# Patient Record
Sex: Female | Born: 1975 | Race: White | Hispanic: No | Marital: Married | Smoking: Never smoker
Health system: Southern US, Community
[De-identification: ages and names within clinical notes are randomized; demographics above are authoritative.]

## PROBLEM LIST (undated history)

## (undated) DIAGNOSIS — M2550 Pain in unspecified joint: Secondary | ICD-10-CM

## (undated) DIAGNOSIS — Z87442 Personal history of urinary calculi: Secondary | ICD-10-CM

## (undated) DIAGNOSIS — J45909 Unspecified asthma, uncomplicated: Secondary | ICD-10-CM

## (undated) HISTORY — PX: BREAST SURGERY: SHX581

---

## 2007-11-19 HISTORY — PX: BREAST EXCISIONAL BIOPSY: SUR124

## 2008-08-16 ENCOUNTER — Ambulatory Visit: Payer: Self-pay | Admitting: Family Medicine

## 2008-08-22 ENCOUNTER — Ambulatory Visit: Payer: Self-pay | Admitting: Family Medicine

## 2008-10-05 ENCOUNTER — Ambulatory Visit: Payer: Self-pay | Admitting: Surgery

## 2008-10-14 ENCOUNTER — Ambulatory Visit: Payer: Self-pay | Admitting: Surgery

## 2009-12-11 IMAGING — MG MAM DGTL DIAGNOSTIC MAMMO W/CAD
1 series · 8 of 8 positions shown · non-contrast
Comparison: none

REASON FOR EXAM: lt breast nodule outer upper quadrant
COMMENTS:

[R CC · right · 8 of 9 slices shown]
[im 1/9]
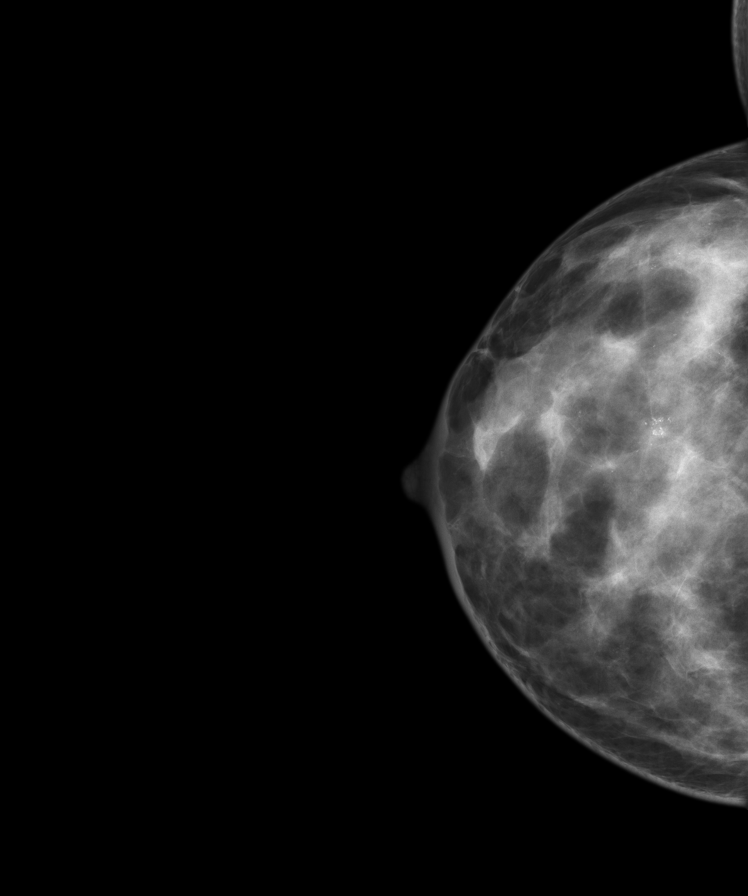
[im 2/9]
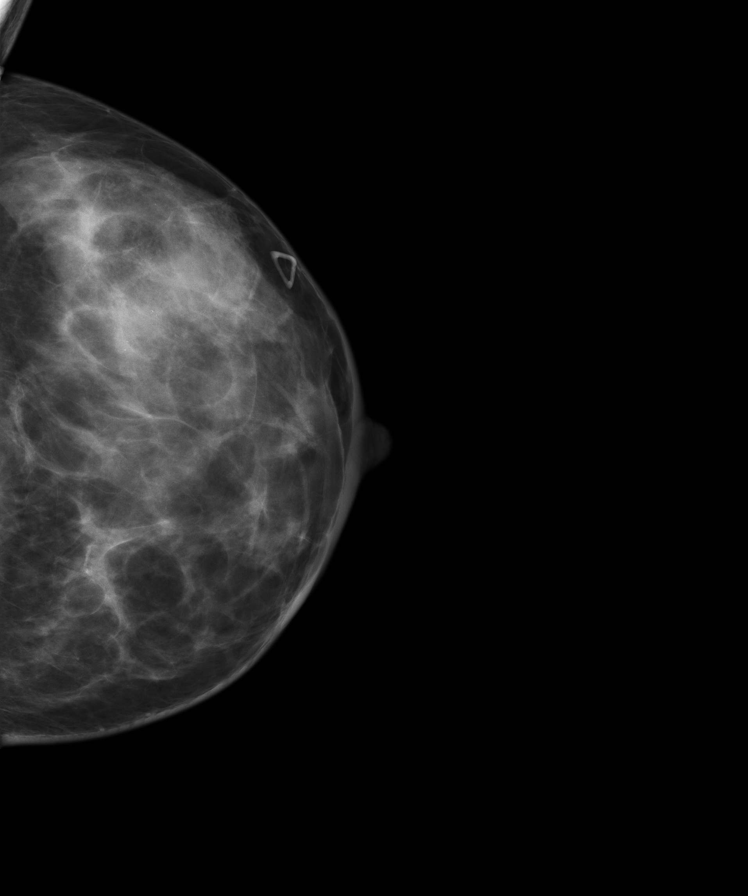
[im 3/9]
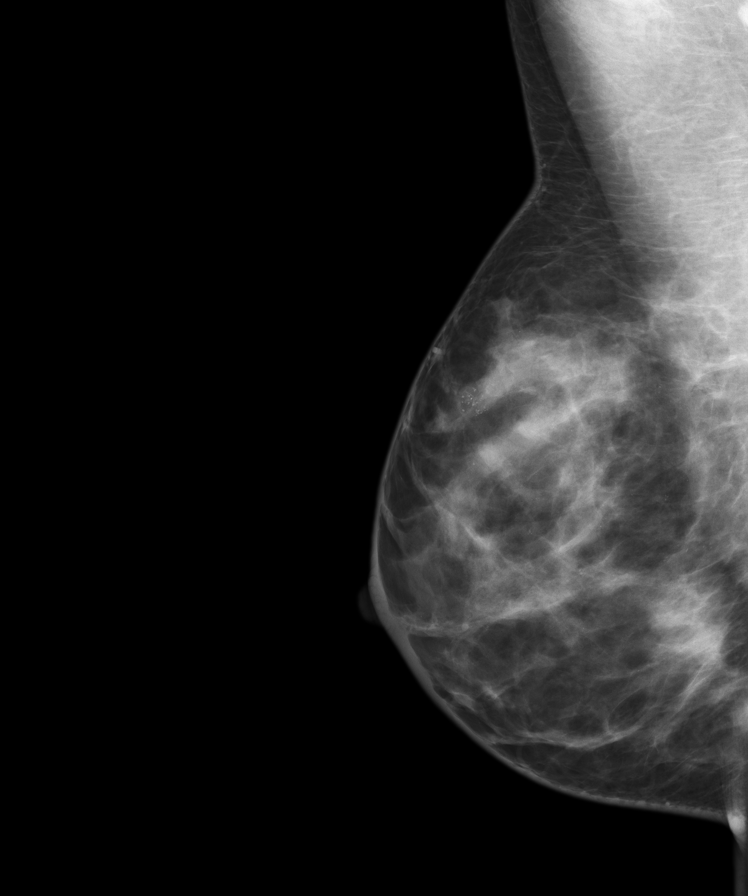
[im 4/9]
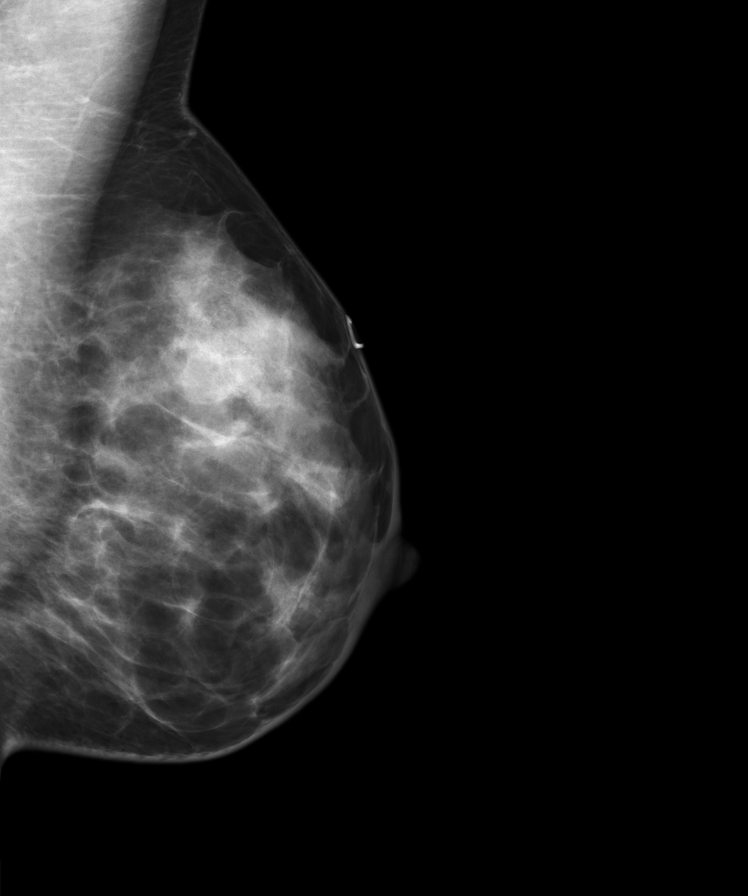
[im 5/9]
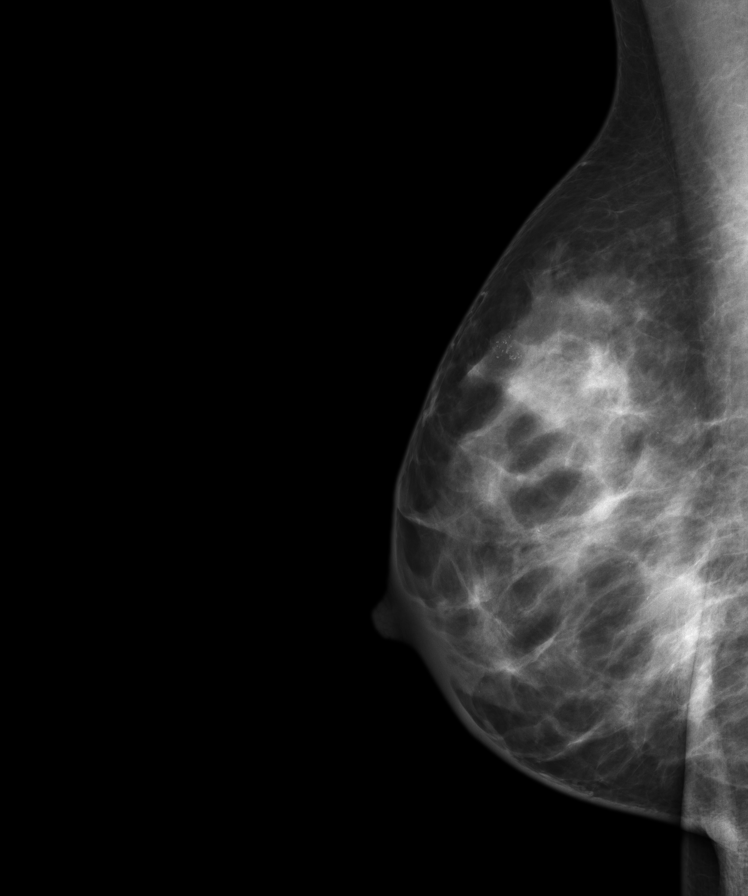
[im 6/9]
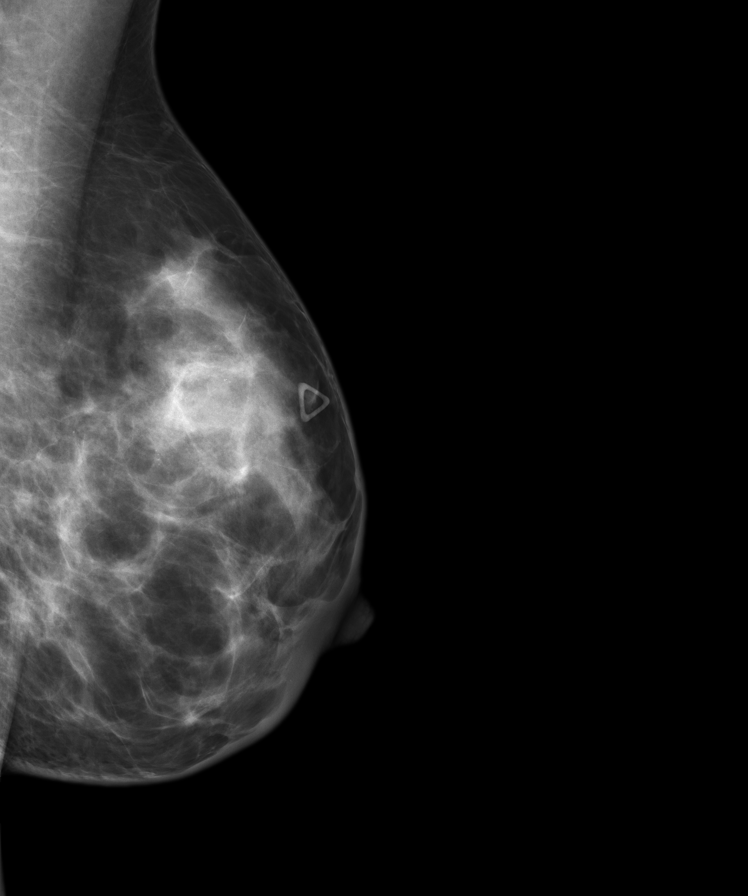
[im 7/9]
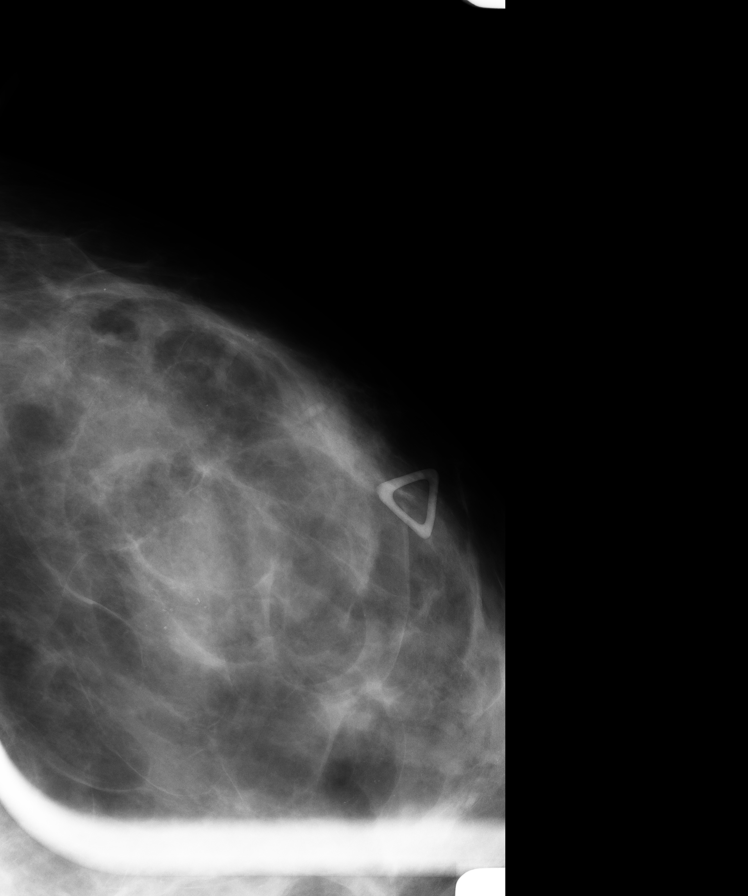
[im 9/9]
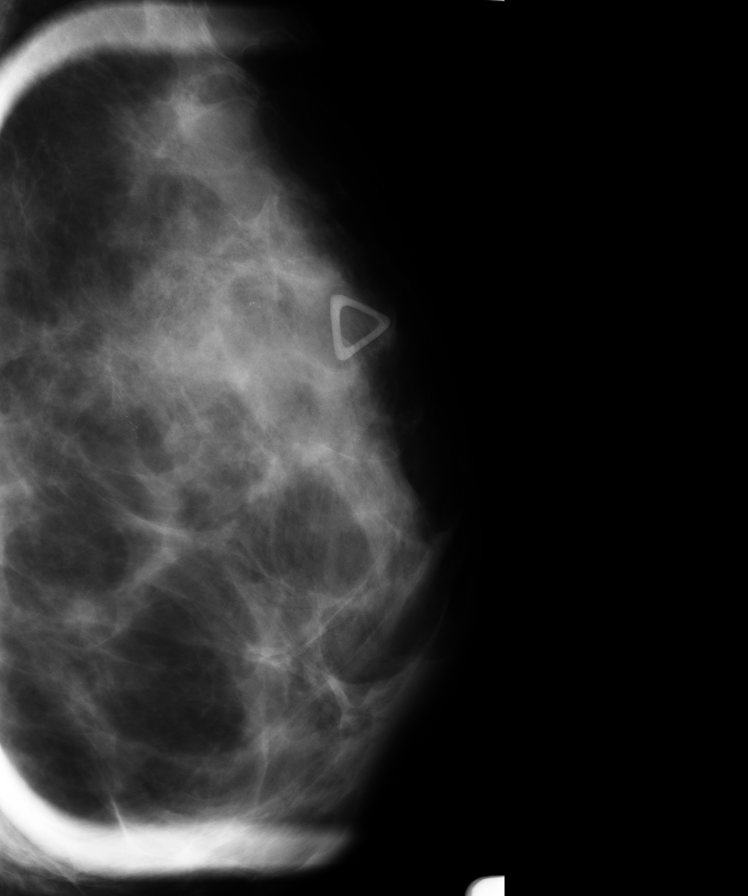

[8 of 8 positions shown; findings below may reference images not displayed]

PROCEDURE:     MAM - MAM DGTL DIAGNOSTIC MAMMO W/CAD  - August 16, 2008 [DATE]

RESULT:     The patient reports nodularity in the upper outer aspect of the
LEFT breast. The patient has an approximately one week history of a painful
nodular area here. The area was marked with a radiodense marker.

The breasts are moderately dense. Somewhat more dense parenchymal tissue is
noted on the LEFT in the upper outer quadrant as compared to the RIGHT.
However, on the RIGHT, there are multiple amorphus microcalcifications and
macrocalcifications noted slightly laterally and predominantly superiorly.
There are other scattered microcalcifications on the RIGHT as well. Spot
compression views of the upper outer aspect of the LEFT breast do not reveal
suspicious findings.  On the RIGHT, calcifications remain on a true lateral
film.
IMPRESSION: 1. On the LEFT, there is a palpable abnormality with relatively few
mammographic findings. Further evaluation with surgical consultation is
recommended. The findings could reflect an infectious process though
neoplasm is not excluded. No discrete abnormal mass is identified
mammographically or at ultrasound. Consideration for percutaneous biopsy
could be given if it is felt appropriate in the opinion of the surgeon.

2. On the RIGHT, there are indeterminate microcalcifications in the upper
and slightly outer aspect of the breast. Spot magnification views are
recommended.

BI-RADS: Category 4-Suspicious Abnormality for the LEFT breast,

BI-RADS: Category 0: Needs Additional Imaging Evaluation for the RIGHT
breast.

A NEGATIVE MAMMOGRAM REPORT DOES NOT PRECLUDE BIOPSY OR OTHER EVALUATION OF
A CLINICALLY PALPABLE OR OTHERWISE SUSPICIOUS MASS OR LESION. BREAST CANCER
MAY NOT BE DETECTED BY MAMMOGRAPHY IN UP TO 10% OF CASES.

## 2015-07-25 ENCOUNTER — Ambulatory Visit: Payer: Self-pay

## 2015-07-25 ENCOUNTER — Encounter: Payer: Self-pay | Admitting: Podiatry

## 2015-07-25 ENCOUNTER — Ambulatory Visit (INDEPENDENT_AMBULATORY_CARE_PROVIDER_SITE_OTHER): Payer: 59 | Admitting: Podiatry

## 2015-07-25 VITALS — BP 123/76 | HR 88 | Resp 16

## 2015-07-25 DIAGNOSIS — M79673 Pain in unspecified foot: Secondary | ICD-10-CM

## 2015-07-25 DIAGNOSIS — M216X9 Other acquired deformities of unspecified foot: Secondary | ICD-10-CM | POA: Diagnosis not present

## 2015-07-25 DIAGNOSIS — M2580 Other specified joint disorders, unspecified joint: Secondary | ICD-10-CM

## 2015-07-25 MED ORDER — DICLOFENAC SODIUM 75 MG PO TBEC: 75.0000 mg | DELAYED_RELEASE_TABLET | Freq: Two times a day (BID) | ORAL | Status: AC

## 2015-07-26 NOTE — Progress Notes (Signed)
Subjective:     Patient ID: Kim Curtis, female   DOB: 1976/02/01, 39 y.o.   MRN: [id number]  HPI 39 year old female presents the office today with pain to the inside portion of both of her feet most ball of the foot. She states is in ongoing for about 6 months, and has been progressive. She has pain to the inside ball of her foot after she is on her feet for quite some time (she is a Producer, television/film/video), but it is becoming more consistent. She also has pain when she wears heels or wedge shoes. She's had no previous treatment for this. She denies any history of injury or trauma. Denies these swelling or redness. No tenderness. The pain does not wake up at night. No other complaints at this time.  Review of Systems  All other systems reviewed and are negative.      Objective:   Physical Exam AAO x3, NAD DP/PT pulses palpable bilaterally, CRT less than 3 seconds Protective sensation intact with Simms Weinstein monofilament, vibratory sensation intact, Achilles tendon reflex intact There is tenderness to palpation overlying the plantar aspect of both the medial and lateral sesamoid bilaterally. There is a small amount of fluid underlying the sesamoid complex the left foot. There is no overlying erythema or increase in warmth. There is mpj range of motion. There is prominence the metatarsal heads with atrophy of the fat pad. No other areas of tenderness to bilateral lower extremities. MMT 5/5, ROM WNL.  No open lesions or pre-ulcerative lesions.  No overlying edema, erythema, increase in warmth to bilateral lower extremities.  No pain with calf compression, swelling, warmth, erythema bilaterally.      Assessment:     39 year old female with metatarsalgia, sesamoiditis    Plan:     -X-rays were obtained and reviewed with the patient.  -Treatment options discussed including all alternatives, risks, and complications -Discussed likely etiology of her symptoms.  -Metatarsal pads dispensed to help  offload the symptomatic area. -Prescribed voltaren. Discussed side effects of the medication and directed to stop if any are to occur and call the office. Hold off on mobic or other anti-inflammatories.  -Discussed that she'll likely benefit from custom orthotics. She will purchase an over-the-counter pair. -Follow-up in 4 weeks if symptoms continue or sooner if any problems arise. In the meantime, encouraged to call the office with any questions, concerns, change in symptoms.   Ovid Curd, DPM

## 2015-08-29 ENCOUNTER — Ambulatory Visit: Payer: 59 | Admitting: Podiatry

## 2015-09-05 ENCOUNTER — Encounter: Payer: Self-pay | Admitting: Podiatry

## 2015-09-05 ENCOUNTER — Ambulatory Visit (INDEPENDENT_AMBULATORY_CARE_PROVIDER_SITE_OTHER): Payer: 59 | Admitting: Podiatry

## 2015-09-05 VITALS — BP 109/81 | HR 92 | Resp 18

## 2015-09-05 DIAGNOSIS — M216X9 Other acquired deformities of unspecified foot: Secondary | ICD-10-CM

## 2015-09-05 DIAGNOSIS — M2580 Other specified joint disorders, unspecified joint: Secondary | ICD-10-CM | POA: Diagnosis not present

## 2015-09-09 NOTE — Progress Notes (Signed)
Patient ID: Earnest BaileyGinger M Tortora, female   DOB: 03/07/1976, 39 y.o.   MRN: [id number]  Subjective: Patient presents the office in follow-up evaluation of metatarsalgia and sesamoiditis. She states that the offloading pads dispensed last time helped tremendously. She gets occasional discomfort at times however she feels that she is much improved. She is inquiring about possibly purchasing the pads. She also states that she was taken for full tear in what seems to help as well. She denies any side effects the medicine. No other complaints at this time.  Objective: AAO 3, NAD DP/PT pulses 2/4, CRT less than 3 seconds Protective sensation intact with Simms wants monofilament At this time there is no specific tenderness to palpation along the metatarsal heads on the sesamoids to bilateral feet. There is no area tenderness to bilateral lower extremity. There is edema, erythema, increased warmth bilaterally. no open lesions or pre-ulcerative lesions. MMT 5/5, ROM WNL No pain with calf compression, swelling, warmth, erythema.  Assessment: 39 year old female resolving metatarsalgia and cellulitis with metatarsal offloading pads  Plan: -Treatment options discussed including all alternatives, risks, and complications -I discussed custom orthotics to help incorporate a metatarsal pad. She wishes to hold off on that. -Continue metatarsal pads. She did purchase more these today. -Continue supportive shoe gear. Hold off on high heel shoes. -Follow-up of symptoms recur or worsen. In the meantime call the office with any questions, concerns, change in symptoms.  Ovid CurdMatthew Wagoner, DPM

## 2016-05-31 ENCOUNTER — Ambulatory Visit: Payer: Self-pay | Admitting: Physician Assistant

## 2016-05-31 ENCOUNTER — Encounter: Payer: Self-pay | Admitting: Physician Assistant

## 2016-05-31 VITALS — BP 100/75 | HR 79 | Temp 98.4°F

## 2016-05-31 DIAGNOSIS — S39012A Strain of muscle, fascia and tendon of lower back, initial encounter: Secondary | ICD-10-CM

## 2016-05-31 MED ORDER — METHOCARBAMOL 750 MG PO TABS: 750.0000 mg | ORAL_TABLET | Freq: Four times a day (QID) | ORAL | Status: DC

## 2016-05-31 NOTE — Progress Notes (Signed)
   Subjective:Back spasms    Patient ID: Kim BaileyGinger M Urista, female    DOB: 09/15/1976, 40 y.o.   MRN: [id number]  HPI Patient c/o right lateral back spasms for 3 days. Denies increase activity. Denies radicular pain. No Urinary compliant. No palliative measures for compliant.    Review of Systems  Arthritis    Objective:   Physical Exam No acute distress. No spinal deformity . No spinal guarding. F/E ROM with right paraspinal muscle spasms. No CVA guarding.       Assessment & Plan:Muscle strain  Trial of Robaxin for 3-5 days. Follow 3 days if no improvement.

## 2017-02-18 ENCOUNTER — Ambulatory Visit: Payer: Self-pay | Admitting: Physician Assistant

## 2017-02-18 ENCOUNTER — Encounter: Payer: Self-pay | Admitting: Physician Assistant

## 2017-02-18 VITALS — BP 128/78 | HR 101 | Temp 98.3°F

## 2017-02-18 DIAGNOSIS — M779 Enthesopathy, unspecified: Secondary | ICD-10-CM

## 2017-02-18 NOTE — Progress Notes (Signed)
S: c/o r thumb and wrist pain, states she planted a few plants this weekend and now her thumb really hurts, also index finger keeps getting stuck, hx of arthritic type problems with no dx of RA and tendonitis  O: vitals wnl, nad, skin intact, r thumb is a little tender, full rom, no swelling or bruising noted, r index finger with full rom, n/v intact; thumb spica splint applied  A: tendonitis  P: f/u with ortho if not better in 1 week, use diclofenac

## 2017-03-21 ENCOUNTER — Ambulatory Visit: Payer: Self-pay | Admitting: Physician Assistant

## 2017-03-24 ENCOUNTER — Other Ambulatory Visit: Payer: Self-pay | Admitting: Family Medicine

## 2017-03-24 DIAGNOSIS — R9389 Abnormal findings on diagnostic imaging of other specified body structures: Secondary | ICD-10-CM

## 2017-11-04 ENCOUNTER — Encounter
Admission: RE | Admit: 2017-11-04 | Discharge: 2017-11-04 | Disposition: A | Discharge status: Home / Self Care | Payer: Managed Care, Other (non HMO) | Source: Ambulatory Visit | Attending: Obstetrics and Gynecology | Admitting: Obstetrics and Gynecology

## 2017-11-04 ENCOUNTER — Encounter: Payer: Self-pay | Admitting: *Deleted

## 2017-11-04 ENCOUNTER — Other Ambulatory Visit: Payer: Self-pay

## 2017-11-04 DIAGNOSIS — Z87442 Personal history of urinary calculi: Secondary | ICD-10-CM | POA: Insufficient documentation

## 2017-11-04 DIAGNOSIS — Z01812 Encounter for preprocedural laboratory examination: Secondary | ICD-10-CM | POA: Diagnosis not present

## 2017-11-04 DIAGNOSIS — M259 Joint disorder, unspecified: Secondary | ICD-10-CM | POA: Insufficient documentation

## 2017-11-04 DIAGNOSIS — J45909 Unspecified asthma, uncomplicated: Secondary | ICD-10-CM | POA: Diagnosis not present

## 2017-11-04 HISTORY — DX: Personal history of urinary calculi: Z87.442

## 2017-11-04 HISTORY — DX: Unspecified asthma, uncomplicated: J45.909

## 2017-11-04 HISTORY — DX: Pain in unspecified joint: M25.50

## 2017-11-04 LAB — BASIC METABOLIC PANEL
Anion gap: 8 (ref 5–15)
BUN: 13 mg/dL (ref 6–20)
CHLORIDE: 105 mmol/L (ref 101–111)
CO2: 24 mmol/L (ref 22–32)
CREATININE: 0.67 mg/dL (ref 0.44–1.00)
Calcium: 9.6 mg/dL (ref 8.9–10.3)
GFR calc Af Amer: >60 mL/min (ref >60)
GFR calc non Af Amer: >60 mL/min (ref >60)
GLUCOSE: 127 mg/dL — AB (ref 65–99)
POTASSIUM: 3.9 mmol/L (ref 3.5–5.1)
Sodium: 137 mmol/L (ref 135–145)

## 2017-11-04 LAB — CBC
HCT: 42.5 % (ref 35.0–47.0)
HEMOGLOBIN: 14.4 g/dL (ref 12.0–16.0)
MCH: 31.7 pg (ref 26.0–34.0)
MCHC: 33.9 g/dL (ref 32.0–36.0)
MCV: 93.4 fL (ref 80.0–100.0)
PLATELETS: UNDETERMINED 10*3/uL (ref 150–440)
RBC: 4.55 MIL/uL (ref 3.80–5.20)
RDW: 12.8 % (ref 11.5–14.5)
WBC: 11.9 10*3/uL — AB (ref 3.6–11.0)

## 2017-11-04 LAB — TYPE AND SCREEN
ABO/RH(D): A POS
Antibody Screen: NEGATIVE

## 2017-11-04 NOTE — Patient Instructions (Signed)
Your procedure is scheduled on: Friday 12/28 Report to Day Surgery. To find out your arrival time please call [phone] between 1PM - 3PM on Thurs. 12.27.  Remember: Instructions that are not followed completely may result in serious medical risk, up to and including death, or upon the discretion of your surgeon and anesthesiologist your surgery may need to be rescheduled.     _X__ 1. Do not eat food after midnight the night before your procedure.                 No gum chewing or hard candies. You may drink clear liquids up to 2 hours                 before you are scheduled to arrive for your surgery- DO not drink clear                 liquids within 2 hours of the start of your surgery.                 Clear Liquids include:  water, apple juice without pulp, clear carbohydrate                 drink such as Clearfast of Gartorade, Black Coffee or Tea (Do not add                 anything to coffee or tea).     _X__ 2.  No Alcohol for 24 hours before or after surgery.   __ 3.  Do Not Smoke or use e-cigarettes For 24 Hours Prior to Your Surgery.                 Do not use any chewable tobacco products for at least 6 hours prior to                 surgery.  ____  4.  Bring all medications with you on the day of surgery if instructed.   _x___  5.  Notify your doctor if there is any change in your medical condition      (cold, fever, infections).     Do not wear jewelry, make-up, hairpins, clips or nail polish. Do not wear lotions, powders, or perfumes. You may wear deodorant. Do not shave 48 hours prior to surgery. Men may shave face and neck. Do not bring valuables to the hospital.    [facility] is not responsible for any belongings or valuables.  Contacts, dentures or bridgework may not be worn into surgery. Leave your suitcase in the car. After surgery it may be brought to your room. For patients admitted to the hospital, discharge time is determined by  your treatment team.   Patients discharged the day of surgery will not be allowed to drive home.   Please read over the following fact sheets that you were given:    __x__ Take these medicines the morning of surgery with A SIP OF WATER:    1. traMADol (ULTRAM) 50 MG tablet if needed  2.   3.   4.  5.  6.  ____ Fleet Enema (as directed)   _x___ Use CHG Soap as directed  _x___ Use inhalers on the day of surgeryalbuterol (PROVENTIL HFA;VENTOLIN HFA) 108 (90 Base) MCG/ACT inhaler,budesonide-formoterol (SYMBICORT) 160-4.5 MCG/ACT inhaler  ____ Stop metformin 2 days prior to surgery    ____ Take 1/2 of usual insulin dose the night before surgery. No insulin the morning  of surgery.   ____ Stop Coumadin/Plavix/aspirin on   __x__ Stop Anti-inflammatories on diclofenac (VOLTAREN) 75 MG EC tablet 12/21 No Ibuprofen or Aleve May take tylenol   ____ Stop supplements until after surgery.    ____ Bring C-Pap to the hospital.

## 2017-11-04 NOTE — Pre-Procedure Instructions (Signed)
Received call from lab platelets clumping unable to do platelet count.  Dr Dalbert GarnetBeasley

## 2017-11-04 NOTE — H&P (Signed)
Patient ID: Kim Curtis is a 41 y.o. female presenting with Pre Op Consulting  on 11/04/2017  HPI: Chronic pelvic pain, worsening over last three months. Is now daily, significantly negatively interfering with quality of life. She is unable to function without debilitating pain, in pelvic girdle, and particularly in her back. Tramadol helps but causes itching. She declined Lupron trial. She denies bladder sx, occasionally has pain with bowel movements but not frequently.  She has been treated as though she has endometriosis, but has not had a good response to hormonal modulation recently. Her back pain is significantly bothersome.  She [facility], who recommended a [facility]  Workup:  Pap: 11/18- neg/neg EMBx: Because my suspicion for endometrial cancer is low, and because all pelvic exams are associated with significantly pelvic pain, I have deferred an EMBx on this 41yo patient without AUB  TVUS:   Retroverted ut, measuring 7x4x4cm Left ovarian cyst, complex Normal right ovary.  No prior abdominal surgeries   Past Medical History:  has a past medical history of Anxiety, unspecified, Arthritis, Asthma without status asthmaticus, unspecified, Chronic pelvic pain in female, Dysmenorrhea, Myalgia, and Sacroiliitis (CMS-HCC).  Past Surgical History:  has a past surgical history that includes Mastectomy partial / lumpectomy; WISDOM TEETH; and Breast surgery. Family History: family history includes Breast cancer in her mother; High blood pressure (Hypertension) in her father and mother; Leukemia in her father. Social History:  reports that she has never smoked. She has never used smokeless tobacco. She reports that she does not drink alcohol or use drugs. OB/GYN History:          OB History    Gravida  2   Para  2   Term  2   Preterm      AB      Living  2     SAB      TAB      Ectopic      Molar      Multiple       Live Births             Allergies: is allergic to tramadol. Medications:  Current Outpatient Medications:  .  albuterol 90 mcg/actuation inhaler, Inhale 2 inhalations into the lungs every 6 (six) hours as needed for Wheezing., Disp: 1 Inhaler, Rfl: 1 .  benztropine (COGENTIN) 0.5 MG tablet, Take 1 tablet (0.5 mg total) by mouth once daily, Disp: 30 tablet, Rfl: 1 .  budesonide-formoterol (SYMBICORT) 160-4.5 mcg/actuation inhaler, Inhale 2 inhalations into the lungs 2 (two) times daily., Disp: 1 Inhaler, Rfl: 1 .  diclofenac (VOLTAREN) 75 MG EC tablet, Take 1 tablet (75 mg total) by mouth 2 (two) times daily., Disp: 60 tablet, Rfl: 3 .  DULoxetine (CYMBALTA) 30 MG DR capsule, TAKE ONE CAPSULE BY MOUTH ONCE DAILY, Disp: 30 capsule, Rfl: 11 .  gabapentin (NEURONTIN) 300 MG capsule, Take one cap nightly, can double to two caps nightly if needed as needed, Disp: 90 capsule, Rfl: 1 .  HYDROcodone-acetaminophen (NORCO) 5-325 mg tablet, Take 1 by mouth every 6 hours as needed for pain., Disp: 20 tablet, Rfl: 0 .  methylPREDNISolone (MEDROL DOSEPACK) 4 mg tablet, Follow package directions., Disp: 21 tablet, Rfl: 0 .  norgestrel-ethinyl estradiol (OGESTREL) 0.5-50 mg-mcg tablet, Take 1 tablet by mouth once daily, Disp: 3 Package, Rfl: 4 .  traMADol (ULTRAM) 50 mg tablet, Take 1 tablet up to twice daily every 12 hours as needed,  Disp: 20 tablet, Rfl: 0   Review of Systems: No SOB, no palpitations or chest pain, no new lower extremity edema, no nausea or vomiting or bowel or bladder complaints. See HPI for gyn specific ROS.   Exam:   BP 107/81   Pulse (!) 112   Wt 71.7 kg (158 lb)   BMI 27.99 kg/m   General: Patient is well-groomed, well-nourished, appears stated age in no acute distress  HEENT: head is atraumatic and normocephalic, trachea is midline, neck is supple with no palpable nodules  CV: Regular rhythm and normal heart rate, no murmur  Pulm: Clear to  auscultation throughout lung fields with no wheezing, crackles, or rhonchi. No increased work of breathing  Abdomen: soft , no mass, non-tender, no rebound tenderness, no hepatomegaly  Pelvic: deferred today   Impression:   The encounter diagnosis was Preop examination.    Plan:   Patient returns for a preoperative discussion regarding her plans to proceed with surgical treatment of her acute on chronic pelvic pain by total laparoscopic hysterectomy with bilateral salpingectomy and possible unilateral oopherectomy procedure. We will perform a cystoscopy with hydro- distention to evaluate the urinary tract after the procedure.   The patient and I discussed the technical aspects of the procedure including the potential for risks and complications. These include but are not limited to the risk of infection requiring post-operative antibiotics or further procedures. We talked about the risk of injury to adjacent organs including bladder, bowel, ureter, blood vessels or nerves. We talked about the need to convert to an open incision. We talked about the possible need for blood transfusion. We talked aboutpostop complications such asthromboembolic or cardiopulmonary complications. All of her questions were answered.  Her preoperative exam was completed and the appropriate consents were signed. She is scheduled to undergo this procedure in the near future.  Specific Peri-operative Considerations:  - Consent: obtained today - Health Maintenance: up to date - Labs: CBC, CMP preoperatively - Studies: EKG, CXR preoperatively - Bowel Preparation: None required - Abx:  Cefoxitin 2g - VTE ppx: SCDs perioperatively - Glucose Protocol: n/a - Beta-blockade: n/a  She is aware that this surgery may not solve her pain, but that given her sx it is reasonable to pursue. I have offered her a trial of Lupron, and dx lap with bx, and she would like to proceed to definitive surgery. I have  offered a referral to GI to consider a GI etiology to her pain, and she would like to move ahead with hysterectomy. She has seen the MIGS specialist who recommended hysterectomy. She consented now for laparotomy if needed.   If it appears that endometriosis is the culprit and she does have an endometrioma, I will remove her ovary.  She is aware that we will recommend pelvic floor physical therapy after surgery to assist with pelvic floor muscular involvement.  She is aware that though two tramadol tabs daily controls her pain, after surgery if she continues to need this level to function, she will need to follow with the chronic pain clinic, and I offered her the chronic pelvic pain clinic referral again today.  My hope is that she will feel so significantly better that any residual sx will be manageable. For now, though, she is spending full days in bed due to pain.

## 2017-11-13 MED ORDER — CEFAZOLIN SODIUM-DEXTROSE 2-4 GM/100ML-% IV SOLN
2.0000 g | INTRAVENOUS | Status: AC
  Administered 2017-11-14: 2 g via INTRAVENOUS

## 2017-11-14 ENCOUNTER — Observation Stay
Admission: RE | Admit: 2017-11-14 | Discharge: 2017-11-15 | Disposition: A | Discharge status: Home / Self Care | Payer: Managed Care, Other (non HMO) | Source: Ambulatory Visit | Attending: Obstetrics and Gynecology | Admitting: Obstetrics and Gynecology

## 2017-11-14 ENCOUNTER — Other Ambulatory Visit: Payer: Self-pay

## 2017-11-14 ENCOUNTER — Ambulatory Visit: Payer: Managed Care, Other (non HMO) | Admitting: Anesthesiology

## 2017-11-14 ENCOUNTER — Encounter
Admission: RE | Disposition: A | Discharge status: Home / Self Care | Payer: Self-pay | Source: Ambulatory Visit | Attending: Obstetrics and Gynecology

## 2017-11-14 ENCOUNTER — Encounter: Payer: Self-pay | Admitting: *Deleted

## 2017-11-14 DIAGNOSIS — N8302 Follicular cyst of left ovary: Secondary | ICD-10-CM | POA: Insufficient documentation

## 2017-11-14 DIAGNOSIS — N939 Abnormal uterine and vaginal bleeding, unspecified: Secondary | ICD-10-CM | POA: Diagnosis not present

## 2017-11-14 DIAGNOSIS — F419 Anxiety disorder, unspecified: Secondary | ICD-10-CM | POA: Insufficient documentation

## 2017-11-14 DIAGNOSIS — R102 Pelvic and perineal pain unspecified side: Secondary | ICD-10-CM | POA: Diagnosis present

## 2017-11-14 DIAGNOSIS — G8929 Other chronic pain: Secondary | ICD-10-CM | POA: Diagnosis not present

## 2017-11-14 DIAGNOSIS — M1990 Unspecified osteoarthritis, unspecified site: Secondary | ICD-10-CM | POA: Diagnosis not present

## 2017-11-14 DIAGNOSIS — N946 Dysmenorrhea, unspecified: Secondary | ICD-10-CM | POA: Insufficient documentation

## 2017-11-14 DIAGNOSIS — Z79899 Other long term (current) drug therapy: Secondary | ICD-10-CM | POA: Diagnosis not present

## 2017-11-14 HISTORY — PX: LAPAROSCOPIC BILATERAL SALPINGECTOMY: SHX5889

## 2017-11-14 HISTORY — PX: LAPAROSCOPIC HYSTERECTOMY: SHX1926

## 2017-11-14 HISTORY — PX: CYSTO WITH HYDRODISTENSION: SHX5453

## 2017-11-14 LAB — POCT PREGNANCY, URINE: Preg Test, Ur: NEGATIVE

## 2017-11-14 LAB — ABO/RH: ABO/RH(D): A POS

## 2017-11-14 LAB — PLATELET COUNT: Platelets: 272 10*3/uL (ref 150–440)

## 2017-11-14 SURGERY — HYSTERECTOMY, TOTAL, LAPAROSCOPIC
Anesthesia: General

## 2017-11-14 MED ORDER — FENTANYL CITRATE (PF) 250 MCG/5ML IJ SOLN
INTRAMUSCULAR | Status: AC
  Filled 2017-11-14: qty 5

## 2017-11-14 MED ORDER — MINOCYCLINE HCL ER 90 MG PO CP24
90.0000 mg | ORAL_CAPSULE | Freq: Every evening | ORAL | Status: DC | PRN
  Filled 2017-11-14: qty 1

## 2017-11-14 MED ORDER — ONDANSETRON HCL 4 MG/2ML IJ SOLN: 4.0000 mg | Freq: Four times a day (QID) | INTRAMUSCULAR | Status: DC | PRN

## 2017-11-14 MED ORDER — HYDROMORPHONE HCL 1 MG/ML IJ SOLN
INTRAMUSCULAR | Status: AC
  Filled 2017-11-14: qty 1

## 2017-11-14 MED ORDER — LIDOCAINE HCL (CARDIAC) 20 MG/ML IV SOLN
INTRAVENOUS | Status: DC | PRN
  Administered 2017-11-14: 100 mg via INTRAVENOUS

## 2017-11-14 MED ORDER — ACETAMINOPHEN 500 MG PO TABS
1000.0000 mg | ORAL_TABLET | Freq: Four times a day (QID) | ORAL | Status: DC
  Administered 2017-11-14 – 2017-11-15 (×3): 1000 mg via ORAL
  Filled 2017-11-14 (×3): qty 2

## 2017-11-14 MED ORDER — DOCUSATE SODIUM 100 MG PO CAPS
100.0000 mg | ORAL_CAPSULE | Freq: Two times a day (BID) | ORAL | Status: DC
  Administered 2017-11-14 – 2017-11-15 (×2): 100 mg via ORAL
  Filled 2017-11-14 (×2): qty 1

## 2017-11-14 MED ORDER — FENTANYL CITRATE (PF) 100 MCG/2ML IJ SOLN
INTRAMUSCULAR | Status: AC
  Administered 2017-11-14: 25 ug via INTRAVENOUS
  Filled 2017-11-14: qty 2

## 2017-11-14 MED ORDER — KETOROLAC TROMETHAMINE 30 MG/ML IJ SOLN: 30.0000 mg | Freq: Once | INTRAMUSCULAR | Status: DC

## 2017-11-14 MED ORDER — OXYCODONE HCL 5 MG PO CAPS: 5.0000 mg | ORAL_CAPSULE | Freq: Four times a day (QID) | ORAL | 0 refills | Status: DC | PRN

## 2017-11-14 MED ORDER — TRAMADOL HCL 50 MG PO TABS
100.0000 mg | ORAL_TABLET | Freq: Four times a day (QID) | ORAL | Status: DC | PRN
  Administered 2017-11-14: 100 mg via ORAL
  Filled 2017-11-14: qty 2

## 2017-11-14 MED ORDER — OXYCODONE HCL 5 MG PO TABS
5.0000 mg | ORAL_TABLET | ORAL | Status: DC | PRN
  Administered 2017-11-14 – 2017-11-15 (×4): 5 mg via ORAL
  Filled 2017-11-14 (×6): qty 1

## 2017-11-14 MED ORDER — DULOXETINE HCL 30 MG PO CPEP
30.0000 mg | ORAL_CAPSULE | Freq: Every day | ORAL | Status: DC
  Administered 2017-11-14: 30 mg via ORAL
  Filled 2017-11-14 (×2): qty 1

## 2017-11-14 MED ORDER — ROCURONIUM BROMIDE 100 MG/10ML IV SOLN
INTRAVENOUS | Status: DC | PRN
  Administered 2017-11-14: 20 mg via INTRAVENOUS
  Administered 2017-11-14: 30 mg via INTRAVENOUS

## 2017-11-14 MED ORDER — LACTATED RINGERS IV SOLN
INTRAVENOUS | Status: DC
  Administered 2017-11-14: via INTRAVENOUS

## 2017-11-14 MED ORDER — HYDROMORPHONE HCL 1 MG/ML IJ SOLN
0.5000 mg | INTRAMUSCULAR | Status: AC | PRN
  Administered 2017-11-14 (×4): 0.5 mg via INTRAVENOUS

## 2017-11-14 MED ORDER — PROPOFOL 10 MG/ML IV BOLUS
INTRAVENOUS | Status: DC | PRN
  Administered 2017-11-14: 150 mg via INTRAVENOUS

## 2017-11-14 MED ORDER — KETOROLAC TROMETHAMINE 30 MG/ML IJ SOLN
INTRAMUSCULAR | Status: DC | PRN
  Administered 2017-11-14: 30 mg via INTRAVENOUS

## 2017-11-14 MED ORDER — LACTATED RINGERS IV SOLN
INTRAVENOUS | Status: DC | PRN
  Administered 2017-11-14: 07:00:00 via INTRAVENOUS

## 2017-11-14 MED ORDER — DICLOFENAC SODIUM 75 MG PO TBEC
75.0000 mg | DELAYED_RELEASE_TABLET | Freq: Two times a day (BID) | ORAL | Status: DC
  Administered 2017-11-15: 75 mg via ORAL
  Filled 2017-11-14 (×3): qty 1

## 2017-11-14 MED ORDER — ACETAMINOPHEN NICU IV SYRINGE 10 MG/ML
INTRAVENOUS | Status: AC
  Filled 2017-11-14: qty 1

## 2017-11-14 MED ORDER — BUPIVACAINE HCL (PF) 0.5 % IJ SOLN
INTRAMUSCULAR | Status: AC
  Filled 2017-11-14: qty 30

## 2017-11-14 MED ORDER — MIDAZOLAM HCL 2 MG/2ML IJ SOLN
1.0000 mg | Freq: Once | INTRAMUSCULAR | Status: AC
  Administered 2017-11-14: 1 mg via INTRAVENOUS

## 2017-11-14 MED ORDER — GABAPENTIN 800 MG PO TABS: 800.0000 mg | ORAL_TABLET | Freq: Every day | ORAL | 0 refills | Status: DC

## 2017-11-14 MED ORDER — BACITRACIN-NEOMYCIN-POLYMYXIN 400-5-5000 EX OINT
TOPICAL_OINTMENT | CUTANEOUS | Status: DC | PRN
  Administered 2017-11-14: 1 via TOPICAL
  Filled 2017-11-14 (×2): qty 1

## 2017-11-14 MED ORDER — LACTATED RINGERS IV SOLN
INTRAVENOUS | Status: DC
  Administered 2017-11-14: 07:00:00 via INTRAVENOUS

## 2017-11-14 MED ORDER — BENZTROPINE MESYLATE 0.5 MG PO TABS
0.5000 mg | ORAL_TABLET | Freq: Every day | ORAL | Status: DC
  Filled 2017-11-14 (×2): qty 1

## 2017-11-14 MED ORDER — FAMOTIDINE 20 MG PO TABS
ORAL_TABLET | ORAL | Status: AC
  Filled 2017-11-14: qty 1

## 2017-11-14 MED ORDER — HYDROMORPHONE HCL 1 MG/ML IJ SOLN
INTRAMUSCULAR | Status: DC | PRN
  Administered 2017-11-14: .8 mg via INTRAVENOUS

## 2017-11-14 MED ORDER — FENTANYL CITRATE (PF) 100 MCG/2ML IJ SOLN
25.0000 ug | INTRAMUSCULAR | Status: AC | PRN
  Administered 2017-11-14 (×6): 25 ug via INTRAVENOUS

## 2017-11-14 MED ORDER — HYDROMORPHONE HCL 1 MG/ML IJ SOLN: INTRAMUSCULAR | Status: DC | PRN

## 2017-11-14 MED ORDER — HYDROMORPHONE HCL 1 MG/ML IJ SOLN
INTRAMUSCULAR | Status: AC
  Administered 2017-11-14: 0.5 mg via INTRAVENOUS
  Filled 2017-11-14: qty 1

## 2017-11-14 MED ORDER — IBUPROFEN 800 MG PO TABS: 800.0000 mg | ORAL_TABLET | Freq: Three times a day (TID) | ORAL | 1 refills | Status: DC | PRN

## 2017-11-14 MED ORDER — PROPOFOL 10 MG/ML IV BOLUS
INTRAVENOUS | Status: AC
  Filled 2017-11-14: qty 20

## 2017-11-14 MED ORDER — FENTANYL CITRATE (PF) 100 MCG/2ML IJ SOLN: INTRAMUSCULAR | Status: DC | PRN

## 2017-11-14 MED ORDER — ALBUTEROL SULFATE (2.5 MG/3ML) 0.083% IN NEBU: 3.0000 mL | INHALATION_SOLUTION | Freq: Four times a day (QID) | RESPIRATORY_TRACT | Status: DC | PRN

## 2017-11-14 MED ORDER — HYDROMORPHONE HCL 1 MG/ML IJ SOLN
0.2000 mg | INTRAMUSCULAR | Status: DC | PRN
  Administered 2017-11-14 (×2): 0.4 mg via INTRAVENOUS
  Administered 2017-11-14 (×2): 0.6 mg via INTRAVENOUS
  Filled 2017-11-14 (×4): qty 1

## 2017-11-14 MED ORDER — MIDAZOLAM HCL 2 MG/2ML IJ SOLN
INTRAMUSCULAR | Status: AC
  Filled 2017-11-14: qty 2

## 2017-11-14 MED ORDER — MIDAZOLAM HCL 2 MG/2ML IJ SOLN
INTRAMUSCULAR | Status: DC | PRN
  Administered 2017-11-14: 2 mg via INTRAVENOUS

## 2017-11-14 MED ORDER — ONDANSETRON HCL 4 MG/2ML IJ SOLN
INTRAMUSCULAR | Status: AC
  Filled 2017-11-14: qty 2

## 2017-11-14 MED ORDER — CEFAZOLIN SODIUM-DEXTROSE 2-4 GM/100ML-% IV SOLN
INTRAVENOUS | Status: AC
  Filled 2017-11-14: qty 100

## 2017-11-14 MED ORDER — DEXAMETHASONE SODIUM PHOSPHATE 10 MG/ML IJ SOLN
INTRAMUSCULAR | Status: DC | PRN
  Administered 2017-11-14: 5 mg via INTRAVENOUS

## 2017-11-14 MED ORDER — MOMETASONE FURO-FORMOTEROL FUM 200-5 MCG/ACT IN AERO
2.0000 | INHALATION_SPRAY | Freq: Two times a day (BID) | RESPIRATORY_TRACT | Status: DC
  Administered 2017-11-14 – 2017-11-15 (×2): 2 via RESPIRATORY_TRACT
  Filled 2017-11-14: qty 8.8

## 2017-11-14 MED ORDER — DOCUSATE SODIUM 100 MG PO CAPS: 100.0000 mg | ORAL_CAPSULE | Freq: Two times a day (BID) | ORAL | 0 refills | Status: DC

## 2017-11-14 MED ORDER — FAMOTIDINE 20 MG PO TABS
20.0000 mg | ORAL_TABLET | Freq: Once | ORAL | Status: AC
  Administered 2017-11-14: 20 mg via ORAL

## 2017-11-14 MED ORDER — ONDANSETRON HCL 4 MG/2ML IJ SOLN: 4.0000 mg | Freq: Once | INTRAMUSCULAR | Status: DC | PRN

## 2017-11-14 MED ORDER — ACETAMINOPHEN 500 MG PO TABS: 1000.0000 mg | ORAL_TABLET | Freq: Four times a day (QID) | ORAL | 0 refills | Status: AC

## 2017-11-14 MED ORDER — FENTANYL CITRATE (PF) 100 MCG/2ML IJ SOLN
INTRAMUSCULAR | Status: DC | PRN
  Administered 2017-11-14: 100 ug via INTRAVENOUS

## 2017-11-14 MED ORDER — SIMETHICONE 80 MG PO CHEW
80.0000 mg | CHEWABLE_TABLET | Freq: Four times a day (QID) | ORAL | Status: DC | PRN
  Administered 2017-11-14 – 2017-11-15 (×2): 80 mg via ORAL
  Filled 2017-11-14 (×2): qty 1

## 2017-11-14 MED ORDER — CELECOXIB 200 MG PO CAPS
200.0000 mg | ORAL_CAPSULE | Freq: Two times a day (BID) | ORAL | Status: DC
Start: 2017-11-14 — End: 2017-11-15
  Administered 2017-11-14 – 2017-11-15 (×2): 200 mg via ORAL
  Filled 2017-11-14 (×3): qty 1

## 2017-11-14 MED ORDER — ONDANSETRON HCL 4 MG PO TABS: 4.0000 mg | ORAL_TABLET | Freq: Four times a day (QID) | ORAL | Status: DC | PRN

## 2017-11-14 MED ORDER — GABAPENTIN 300 MG PO CAPS
900.0000 mg | ORAL_CAPSULE | Freq: Every day | ORAL | Status: DC
  Administered 2017-11-14: 900 mg via ORAL
  Filled 2017-11-14: qty 3

## 2017-11-14 MED ORDER — ONDANSETRON HCL 4 MG/2ML IJ SOLN
INTRAMUSCULAR | Status: DC | PRN
  Administered 2017-11-14: 4 mg via INTRAVENOUS

## 2017-11-14 MED ORDER — SUGAMMADEX SODIUM 200 MG/2ML IV SOLN
INTRAVENOUS | Status: DC | PRN
  Administered 2017-11-14: 200 mg via INTRAVENOUS

## 2017-11-14 MED ORDER — MENTHOL 3 MG MT LOZG
1.0000 | LOZENGE | OROMUCOSAL | Status: DC | PRN
  Filled 2017-11-14: qty 9

## 2017-11-14 MED ORDER — ACETAMINOPHEN 10 MG/ML IV SOLN
INTRAVENOUS | Status: DC | PRN
  Administered 2017-11-14: 1000 mg via INTRAVENOUS

## 2017-11-14 MED ORDER — METHYLENE BLUE 0.5 % INJ SOLN
INTRAVENOUS | Status: AC
  Filled 2017-11-14: qty 10

## 2017-11-14 SURGICAL SUPPLY — 86 items
BAG URINE DRAINAGE (UROLOGICAL SUPPLIES) ×12 IMPLANT
BLADE SURG SZ11 CARB STEEL (BLADE) ×6 IMPLANT
CANISTER SUCT 1200ML W/VALVE (MISCELLANEOUS) IMPLANT
CATH FOLEY 2WAY  5CC 16FR (CATHETERS) ×2
CATH URTH 16FR FL 2W BLN LF (CATHETERS) ×4 IMPLANT
CHLORAPREP W/TINT 26ML (MISCELLANEOUS) ×6 IMPLANT
CLOSURE WOUND 1/4X4 (GAUZE/BANDAGES/DRESSINGS)
CORD MONOPOLAR M/FML 12FT (MISCELLANEOUS) ×6 IMPLANT
COUNTER NEEDLE 20/40 LG (NEEDLE) ×6 IMPLANT
COVER LIGHT HANDLE STERIS (MISCELLANEOUS) ×12 IMPLANT
DEFOGGER SCOPE WARMER CLEARIFY (MISCELLANEOUS) ×6 IMPLANT
DERMABOND ADVANCED (GAUZE/BANDAGES/DRESSINGS) ×2
DERMABOND ADVANCED .7 DNX12 (GAUZE/BANDAGES/DRESSINGS) ×4 IMPLANT
DEVICE SUTURE ENDOST 10MM (ENDOMECHANICALS) ×6 IMPLANT
DRAPE LAP W/FLUID (DRAPES) IMPLANT
DRAPE STERI POUCH LG 24X46 STR (DRAPES) ×6 IMPLANT
DRAPE UNDER BUTTOCK W/FLU (DRAPES) ×6 IMPLANT
DRSG TEGADERM 2-3/8X2-3/4 SM (GAUZE/BANDAGES/DRESSINGS) IMPLANT
DRSG TELFA 3X8 NADH (GAUZE/BANDAGES/DRESSINGS) IMPLANT
ELECT CAUTERY BLADE 6.4 (BLADE) IMPLANT
ELECT REM PT RETURN 9FT ADLT (ELECTROSURGICAL)
ELECTRODE REM PT RTRN 9FT ADLT (ELECTROSURGICAL) IMPLANT
GAUZE SPONGE 4X4 12PLY STRL (GAUZE/BANDAGES/DRESSINGS) IMPLANT
GAUZE SPONGE NON-WVN 2X2 STRL (MISCELLANEOUS) IMPLANT
GLOVE BIO SURGEON STRL SZ7 (GLOVE) ×24 IMPLANT
GLOVE INDICATOR 7.5 STRL GRN (GLOVE) ×24 IMPLANT
GOWN STRL REUS W/ TWL LRG LVL3 (GOWN DISPOSABLE) ×8 IMPLANT
GOWN STRL REUS W/ TWL XL LVL3 (GOWN DISPOSABLE) ×4 IMPLANT
GOWN STRL REUS W/TWL LRG LVL3 (GOWN DISPOSABLE) ×4
GOWN STRL REUS W/TWL XL LVL3 (GOWN DISPOSABLE) ×2
GRASPER SUT TROCAR 14GX15 (MISCELLANEOUS) ×6 IMPLANT
IRRIGATION STRYKERFLOW (MISCELLANEOUS) ×4 IMPLANT
IRRIGATOR STRYKERFLOW (MISCELLANEOUS) ×6
IV NS 1000ML (IV SOLUTION) ×2
IV NS 1000ML BAXH (IV SOLUTION) ×4 IMPLANT
KIT PINK PAD W/HEAD ARE REST (MISCELLANEOUS) ×6
KIT PINK PAD W/HEAD ARM REST (MISCELLANEOUS) ×4 IMPLANT
KIT RM TURNOVER CYSTO AR (KITS) ×6 IMPLANT
LABEL OR SOLS (LABEL) ×6 IMPLANT
LIGASURE VESSEL 5MM BLUNT TIP (ELECTROSURGICAL) ×6 IMPLANT
MANIPULATOR VCARE LG CRV RETR (MISCELLANEOUS) ×6 IMPLANT
MANIPULATOR VCARE SML CRV RETR (MISCELLANEOUS) IMPLANT
MANIPULATOR VCARE STD CRV RETR (MISCELLANEOUS) IMPLANT
NS IRRIG 500ML POUR BTL (IV SOLUTION) ×6 IMPLANT
OCCLUDER COLPOPNEUMO (BALLOONS) ×6 IMPLANT
PACK BASIN MAJOR ARMC (MISCELLANEOUS) IMPLANT
PACK GYN LAPAROSCOPIC (MISCELLANEOUS) ×6 IMPLANT
PAD OB MATERNITY 4.3X12.25 (PERSONAL CARE ITEMS) ×6 IMPLANT
PAD PREP 24X41 OB/GYN DISP (PERSONAL CARE ITEMS) ×6 IMPLANT
POUCH SPECIMEN RETRIEVAL 10MM (ENDOMECHANICALS) IMPLANT
SCISSORS METZENBAUM CVD 33 (INSTRUMENTS) ×6 IMPLANT
SET CYSTO W/LG BORE CLAMP LF (SET/KITS/TRAYS/PACK) IMPLANT
SLEEVE ENDOPATH XCEL 5M (ENDOMECHANICALS) ×6 IMPLANT
SOL PREP PVP 2OZ (MISCELLANEOUS) ×6
SOLUTION PREP PVP 2OZ (MISCELLANEOUS) ×4 IMPLANT
SPONGE VERSALON 2X2 STRL (MISCELLANEOUS)
SPONGE XRAY 4X4 16PLY STRL (MISCELLANEOUS) IMPLANT
STAPLER SKIN PROX 35W (STAPLE) IMPLANT
STRIP CLOSURE SKIN 1/4X4 (GAUZE/BANDAGES/DRESSINGS) IMPLANT
SURGILUBE 2OZ TUBE FLIPTOP (MISCELLANEOUS) IMPLANT
SUT ENDO VLOC 180-0-8IN (SUTURE) ×6 IMPLANT
SUT MNCRL 4-0 (SUTURE) ×2
SUT MNCRL 4-0 27XMFL (SUTURE) ×4
SUT MNCRL AB 4-0 PS2 18 (SUTURE) IMPLANT
SUT VIC AB 0 CT1 27 (SUTURE)
SUT VIC AB 0 CT1 27XCR 8 STRN (SUTURE) IMPLANT
SUT VIC AB 0 CT1 36 (SUTURE) ×12 IMPLANT
SUT VIC AB 0 UR5 27 (SUTURE) ×6 IMPLANT
SUT VIC AB 2-0 SH 27 (SUTURE) ×8
SUT VIC AB 2-0 SH 27XBRD (SUTURE) ×16 IMPLANT
SUT VIC AB 2-0 UR6 27 (SUTURE) IMPLANT
SUT VIC AB 4-0 SH 27 (SUTURE)
SUT VIC AB 4-0 SH 27XANBCTRL (SUTURE) IMPLANT
SUT VICRYL PLUS ABS 0 54 (SUTURE) IMPLANT
SUTURE MNCRL 4-0 27XMF (SUTURE) ×4 IMPLANT
SWABSTK COMLB BENZOIN TINCTURE (MISCELLANEOUS) IMPLANT
SYR 10ML LL (SYRINGE) ×6 IMPLANT
SYR 50ML LL SCALE MARK (SYRINGE) ×6 IMPLANT
SYR BULB IRRIG 60ML STRL (SYRINGE) IMPLANT
TRAY FOLEY W/METER SILVER 16FR (SET/KITS/TRAYS/PACK) IMPLANT
TRAY PREP VAG/GEN (MISCELLANEOUS) IMPLANT
TROCAR ENDO BLADELESS 11MM (ENDOMECHANICALS) ×6 IMPLANT
TROCAR XCEL NON-BLD 5MMX100MML (ENDOMECHANICALS) ×12 IMPLANT
TUBING INSUF HEATED (TUBING) ×6 IMPLANT
TUBING INSUFFLATION (TUBING) ×6 IMPLANT
WATER STERILE IRR 1000ML POUR (IV SOLUTION) IMPLANT

## 2017-11-14 NOTE — Anesthesia Preprocedure Evaluation (Signed)
Anesthesia Evaluation  Patient identified by MRN, date of birth, ID band Patient awake    Reviewed: Allergy & Precautions, NPO status , Patient's Chart, lab work & pertinent test results, reviewed documented beta blocker date and time   Airway Mallampati: III  TM Distance: >3 FB     Dental  (+) Chipped   Pulmonary asthma ,           Cardiovascular      Neuro/Psych    GI/Hepatic   Endo/Other    Renal/GU      Musculoskeletal   Abdominal   Peds  Hematology   Anesthesia Other Findings   Reproductive/Obstetrics                             Anesthesia Physical Anesthesia Plan  ASA: II  Anesthesia Plan: General   Post-op Pain Management:    Induction: Intravenous  PONV Risk Score and Plan:   Airway Management Planned: Oral ETT  Additional Equipment:   Intra-op Plan:   Post-operative Plan:   Informed Consent: I have reviewed the patients History and Physical, chart, labs and discussed the procedure including the risks, benefits and alternatives for the proposed anesthesia with the patient or authorized representative who has indicated his/her understanding and acceptance.     Plan Discussed with: CRNA  Anesthesia Plan Comments:         Anesthesia Quick Evaluation

## 2017-11-14 NOTE — Discharge Instructions (Signed)
Discharge instructions after   total laparoscopic hysterectomy   For the next three days, take ibuprofen and acetaminophen on a schedule, every 8 hours. You can take them together or you can intersperse them, and take one every four hours. I also gave you gabapentin for nighttime, to help you sleep and also to control pain. Take gabapentin medicines at night for at least the next 3 nights. You also have a narcotic, oxycodone, to take as needed if the above medicines don't help.  Postop constipation is a major cause of pain. Stay well hydrated, walk as you tolerate, and take over the counter senna as well as stool softeners if you need them.    Signs and Symptoms to Report Call our office at [phone] if you have any of the following.  . Fever over 100.4 degrees or higher . Severe stomach pain not relieved with pain medications . Bright red bleeding that's heavier than a period that does not slow with rest . To go the bathroom a lot (frequency), you can't hold your urine (urgency), or it hurts when you empty your bladder (urinate) . Chest pain . Shortness of breath . Pain in the calves of your legs . Severe nausea and vomiting not relieved with anti-nausea medications . Signs of infection around your wounds, such as redness, hot to touch, swelling, green/yellow drainage (like pus), bad smelling discharge . Any concerns  What You Can Expect after Surgery . You may see some pink tinged, bloody fluid and bruising around the wound. This is normal. . You may notice shoulder and neck pain. This is caused by the gas used during surgery to expand your abdomen so your surgeon could get to the uterus easier. . You may have a sore throat because of the tube in your mouth during general anesthesia. This will go away in 2 to 3 days. . You may have some stomach cramps. . You may notice spotting on your panties. . You may have pain around the incision sites.   Activities after Your  Discharge Follow these guidelines to help speed your recovery at home: . Do the coughing and deep breathing as you did in the hospital for 2 weeks. Use the small blue breathing device, called the incentive spirometer for 2 weeks. . Don't drive if you are in pain or taking narcotic pain medicine. You may drive when you can safely slam on the brakes, turn the wheel forcefully, and rotate your torso comfortably. This is typically 1-2 weeks. Practice in a parking lot or side street prior to attempting to drive regularly.  . Ask others to help with household chores for 4 weeks. . Do not lift anything heavier that 10 pounds for 4-6 weeks. This includes pets, children, and groceries. . Don't do strenuous activities, exercises, or sports like vacuuming, tennis, squash, etc. until your doctor says it is safe to do so. ---Maintain pelvic rest for 8 weeks. This means nothing in the vagina or rectum at all (no douching, tampons, intercourse) for 8 weeks.  . Walk as you feel able. Rest often since it may take two or three weeks for your energy level to return to normal.  . You may climb stairs . Avoid constipation:   -Eat fruits, vegetables, and whole grains. Eat small meals as your appetite will take time to return to normal.   -Drink 6 to 8 glasses of water each day unless your doctor has told you to limit your fluids.   -Use a laxative   or stool softener as needed if constipation becomes a problem. You may take Miralax, metamucil, Citrucil, Colace, Senekot, FiberCon, etc. If this does not relieve the constipation, try two tablespoons of Milk Of Magnesia every 8 hours until your bowels move.  . You may shower. Gently wash the wounds with a mild soap and water. Pat dry. . Do not get in a hot tub, swimming pool, etc. for 6 weeks. . Do not use lotions, oils, powders on the wounds. . Do not douche, use tampons, or have sex until your doctor says it is okay. . Take your pain medicine when you need it. The medicine  may not work as well if the pain is bad.  Take the medicines you were taking before surgery. Other medications you will need are pain medications and possibly constipation and nausea medications (Zofran).    Here is a helpful article from the website [employer], regarding constipation  Here are reasons why constipation occurs after surgery: 1) During the operation and in the recovery room, most people are given opioid pain medication, primarily through an IV, to treat moderate or severe pain. Intravenous opioids include morphine, Dilaudid and fentanyl. After surgery, patients are often prescribed opioid pain medication to take by mouth at home, including codeine, Vicodin, Norco, and Percocet. All of these medications cause constipation by slowing down the movement of your intestine. 2) Changes in your diet before surgery can be another culprit. It is common to get specific instructions to change how you normally eat or drink before your surgery, like only having liquids the day before or not having anything to eat or drink after midnight the night before surgery. For this reason, temporary dehydration may occur. This, along with not eating or only having liquids, means that you are getting less fiber than usual. Both these factors contribute to constipation. 3) Changes in your diet after surgery can also contribute to the problem. Although many people don't have dietary restrictions after operations, being under anesthesia can make you lose your appetite for several hours and maybe even days. Some people can even have nausea or vomiting. Not eating or drinking normally means that you are not getting enough fiber and you can get dehydrated, both leading to constipation. 4) Lying in a bed more than usual--which happens before, during and after surgery--combined with the medications and diet changes, all work together to slow down your colon and make your poop turn to rock.  No one likes to be  constipated.  Let's face it, it's not a pleasant feeling when you don't poop for days, then strain on the toilet to finally pass something large enough to cause damage. An ounce of prevention is worth a pound of cure, so: 1. Assume you will be constipated. 2. Plan and prepare accordingly. Post-surgery is one of those unique situations where the temporary use of laxatives can make a world of difference. Always consult with your doctor, and recognize that if you wait several days after surgery to take a laxative, the constipation might be too severe for these over-the-counter options. It is always important to discuss all medications you plan on taking with your doctor. Ask your doctor if you can start the laxative immediately after surgery. *  Here are go-to post-surgery laxatives: Senna: Senna is an herb that acts as a "stimulant laxative," meaning it increases the activity of the intestine to cause you to have a bowel movement. It comes in many forms, but senna pills are easy to take and   are sold over the counter at almost all pharmacies. Since opioid pain medications slow down the activity of the intestine, it makes sense to take a medication to help reverse that side effect. Long-term use of a stimulant laxative is not a good idea since it can make your colon "lazy" and not function properly; however, temporary use immediately after surgery is acceptable. In general, if you are able to eat a normal diet, taking senna soon after surgery works the best. Senna usually works within hours to produce a bowel movement, but this is less predictable when you are taking different medications after surgery. Try not to wait several days to start taking senna, as often it is too late by then. Just like with all medications or supplements, check with your doctor before starting new treatment.   Magnesium: Magnesium is an important mineral that our body needs. We get magnesium from some foods that we eat, especially  foods that are high in fiber such as broccoli, almonds and whole grains. There are also magnesium-based medications used to treat constipation including milk of magnesia (magnesium hydroxide), magnesium citrate and magnesium oxide. They work by drawing water into the intestine, putting it into the class of "osmotic" laxatives. Magnesium products in low doses appear to be safe, but if taken in very large doses, can lead to problems such as irregular heartbeat, low blood pressure and even death. It can also affect other medications you might be taking, therefore it is important to discuss using magnesium with your physician and pharmacist before initiating therapy. Most over-the-counter magnesium laxatives work very well to help with the constipation related to surgery, but sometimes they work too well and lead to diarrhea. Make sure you are somewhere with easy access to a bathroom, just in case.   Bisacodyl: Bisacodyl (generic name) is sold under brand names such as Dulcolax. Much like senna, it is a "stimulant laxative," meaning it makes your intestines move more quickly to push out the stool. This is another good choice to start taking as soon as your doctor says you can take a laxative after surgery. It comes in pill form and as a suppository, which is a good choice for people who cannot or are not allowed to swallow pills. Studies have shown that it works as a laxative, but like most of these medications, you should use this on a short-term basis only.   Enema: Enemas strike fear in many people, but FEAR NOT! It's nowhere near as big a deal as you may think. An enema is just a way to get some liquid into your rectum by placing a specially designed device through your anus. If you have never done one, it might seem like a painful, unpleasant, uncomfortable, complicated and lengthy procedure. But in reality, it's simple, takes just a few seconds and is highly effective. The small ready-made bottles you buy at  the pharmacy are much easier than the hose/large rubber container type. Those recommended positions illustrated in some instructions are generally not necessary to place the enema. It's very similar to the insertion of a tampon, requiring a slight squat. Some extra lubrication on the enema's tip (or on your anus) will make it a breeze. In certain cases, there is no substitute for a good enema. For example, if someone has not pooped for a few days, the beginning of the poop waiting to come out can become rock hard. Passing that hard stool can lead to much pain and problems like anal fissures. Inserting   a little liquid to break up the rock-hard stool will help make its passage much easier. Enemas come with different liquids. Most come with saline, but there are also mineral oil options. You can also use warm water in the reusable enema containers. They all work. But since saline can sometimes be irritating, so try a mineral oil or water enema instead.  Here are commonly recommended constipation medications that do not work well for post-surgery constipation: Docusate: Docusate (generic name) most commonly referred to as Colace (brand name) is not really a laxative, but is classified as a stool softener. Although this medication is commonly prescribed, it is not recommended for several reasons: 1) there is no good medical evidence that it works 2) even if it has an effect, which is very questionable, it is minimal and cannot combat the intestinal slowing caused by the opioid medications. Skip docusate to save money and space in your pillbox for something more effective.  PEG: Miralax (brand name) is basically a chemical called polyethylene glycol (PEG) and it has gained tremendous popularity as a laxative. This product is an "osmotic laxative" meaning it works by pulling water into the stool, making it softer. This is very similar to the action of natural fiber in foods and supplements. Therefore, the effect seen  by this medication is not immediate, causing a bowel movement in a day or more. Is this medication strong enough to battle the constipation related to having an operation? Maybe for some people not prone to constipation. But for most people, other laxatives are better to prevent constipation after surgery.  

## 2017-11-14 NOTE — Anesthesia Procedure Notes (Signed)
Procedure Name: Intubation Date/Time: 11/14/2017 7:30 AM Performed by: Justus Memory, CRNA Pre-anesthesia Checklist: Patient identified, Patient being monitored, Timeout performed, Emergency Drugs available and Suction available Patient Re-evaluated:Patient Re-evaluated prior to induction Oxygen Delivery Method: Circle system utilized Preoxygenation: Pre-oxygenation with 100% oxygen Induction Type: IV induction Ventilation: Mask ventilation without difficulty Laryngoscope Size: Mac and 3 Grade View: Grade I Tube type: Oral Tube size: 7.0 mm Number of attempts: 1 Airway Equipment and Method: Stylet Placement Confirmation: ETT inserted through vocal cords under direct vision,  positive ETCO2 and breath sounds checked- equal and bilateral Secured at: 19 cm Tube secured with: Tape Dental Injury: Teeth and Oropharynx as per pre-operative assessment

## 2017-11-14 NOTE — Transfer of Care (Signed)
Immediate Anesthesia Transfer of Care Note  Patient: Ziyonna Eugenia McalpineM Spanbauer  Procedure(s) Performed: HYSTERECTOMY TOTAL LAPAROSCOPIC (N/A ) LAPAROSCOPIC BILATERAL SALPINGECTOMY (Bilateral ) LAPAROSCOPIC OOPHORECTOMY (Left ) CYSTOSCOPY/HYDRODISTENSION  Patient Location: PACU  Anesthesia Type:General  Level of Consciousness: sedated  Airway & Oxygen Therapy: Patient Spontanous Breathing and Patient connected to face mask oxygen  Post-op Assessment: Report given to RN and Post -op Vital signs reviewed and stable  Post vital signs: Reviewed and stable  Last Vitals:  Vitals:   11/14/17 0928 11/14/17 0937  BP: (!) 144/87   Pulse: (!) 111 (!) 107  Resp: 16 (!) 22  Temp: (!) 36.3 C   SpO2: 98% 99%    Last Pain:  Vitals:   11/14/17 0937  TempSrc:   PainSc: 10-Worst pain ever      Patients Stated Pain Goal: 3 (11/14/17 [id number])  Complications: No apparent anesthesia complications

## 2017-11-14 NOTE — Anesthesia Postprocedure Evaluation (Signed)
Anesthesia Post Note  Patient: Kim Curtis  Procedure(s) Performed: HYSTERECTOMY TOTAL LAPAROSCOPIC (N/A ) LAPAROSCOPIC BILATERAL SALPINGECTOMY (Bilateral ) LAPAROSCOPIC OOPHORECTOMY (Left ) CYSTOSCOPY/HYDRODISTENSION  Patient location during evaluation: PACU Anesthesia Type: General Level of consciousness: awake and alert Pain management: pain level controlled Vital Signs Assessment: post-procedure vital signs reviewed and stable Respiratory status: spontaneous breathing, nonlabored ventilation, respiratory function stable and patient connected to nasal cannula oxygen Cardiovascular status: blood pressure returned to baseline and stable Postop Assessment: no apparent nausea or vomiting Anesthetic complications: no     Last Vitals:  Vitals:   11/14/17 1100 11/14/17 1115  BP: 137/89 134/79  Pulse: (!) 104 (!) 105  Resp: 10 16  Temp: 36.9 C 36.7 C  SpO2: 98% 100%    Last Pain:  Vitals:   11/14/17 1115  TempSrc: Oral  PainSc: 8                  Martin Smeal S

## 2017-11-14 NOTE — Progress Notes (Signed)
Day of Surgery Procedure(s): HYSTERECTOMY TOTAL LAPAROSCOPIC (N/A) LAPAROSCOPIC BILATERAL SALPINGECTOMY (Bilateral) LAPAROSCOPIC OOPHORECTOMY (Left) CYSTOSCOPY/HYDRODISTENSION Subjective: Pain is adequately controlled. No SOB or CP. Resting quietly.   Objective: Vital signs in last 24 hours: Temp:  [97.2 F (36.2 C)-98.5 F (36.9 C)] 98.1 F (36.7 C) (12/28 1345) Pulse Rate:  [11-115] 101 (12/28 1345) Resp:  [9-22] 16 (12/28 1115) BP: (128-144)/(77-94) 133/77 (12/28 1345) SpO2:  [95 %-100 %] 98 % (12/28 1345) Weight:  [71.7 kg (158 lb)] 71.7 kg (158 lb) (12/28 0615)  Intake/Output  Intake/Output Summary (Last 24 hours) at 11/14/2017 1420 Last data filed at 11/14/2017 1356 Gross per 24 hour  Intake 700 ml  Output 750 ml  Net -50 ml    Physical Exam:  General: Alert and oriented. CV: RRR Lungs: Clear bilaterally. GI: Soft, Nondistended. Incisions: Clean and dry. Urine: Clear, Foley in place Extremities: Nontender, no erythema, no edema.  Assessment/Plan: POD# 0 s/p TLH, unilateral oophorectomy, bilateral salpingectomy.  1) Encourage Incentive spirometry 2) Advance diet as tolerated, but may do clears overnight 3) Continue oral pain medication, with IV rescue as needed 5) OOB to chair for 4 hours this afternoon   Christeen DouglasBethany Niccolas Loeper, MD   LOS: 0 days   Christeen DouglasBethany Lowen Mansouri 11/14/2017, 2:20 PM Patient ID: Earnest BaileyGinger M Kook, female   DOB: 02/21/1976, 41 y.o.   MRN: [id number]

## 2017-11-14 NOTE — Op Note (Addendum)
Kim Curtis PROCEDURE DATE: 11/14/2017  PREOPERATIVE DIAGNOSIS: Chronic pelvic pain; Abnormal bleeding unresponsive to conservative management POSTOPERATIVE DIAGNOSIS: The same PROCEDURE: Total laparoscopic hysterectomy, bilateral salpingectomy, left oophorectomy, cystoscopy with hydrodistention SURGEON:  Dr. Christeen DouglasBethany Madylin Fairbank ASSISTANT: Dr. Leeroy Bockhelsea Ward Anesthesiologist:  Anesthesiologist: Berdine Addisonhomas, Mathai, MD CRNA: Almeta MonasFletcher, Patricia, CRNA; Omer JackWeatherly, Janice, CRNA  INDICATIONS: 41 y.o.  With chronic pelvic pain  here for definitive surgical management secondary to the indications listed under preoperative diagnoses; please see preoperative note for further details.  Risks of surgery were discussed with the patient including but not limited to: bleeding which may require transfusion or reoperation; infection which may require antibiotics; injury to bowel, bladder, ureters or other surrounding organs; need for additional procedures; thromboembolic phenomenon, incisional problems and other postoperative/anesthesia complications. Written informed consent was obtained.    FINDINGS: small uterus, normal bilateral tubes and ovaries. Complex left ovarian cyst. Questionable endometriosis along bilateral uterosacral ligaments and in left ovarian fossa   ANESTHESIA:    General INTRAVENOUS FLUIDS:700  ml ESTIMATED BLOOD LOSS:50 ml URINE OUTPUT: 200 ml   SPECIMENS: Uterus, cervix, bilateral fallopian tubes, left ovary COMPLICATIONS: None immediate  PROCEDURE IN DETAIL:  The patient received prophalactic intravenous antibiotics and had sequential compression devices applied to her lower extremities while in the preoperative area.  She was then taken to the operating room where general anesthesia was administered and was found to be adequate.  She was placed in the dorsal lithotomy position, and was prepped and draped in a sterile manner.  A formal time out was performed with all team members present and  in agreement.  A V-care uterine manipulator was placed at this time.  A Foley catheter was inserted into her bladder and attached to constant drainage. Attention was turned to the abdomen and 0.5% Marcaine infused subq. A 5mm umbilical incision was made with the scalpel.  The Optiview 5-mm trocar and sleeve were then advanced without difficulty with the laparoscope under direct visualization into the abdomen.  The abdomen was then insufflated with carbon dioxide gas and adequate pneumoperitoneum was obtained.  A survey of the patient's pelvis and abdomen revealed the findings above.  Bilateral lower quadrant ports (5 mm on the right and 10 mm on the left) were then placed under direct visualization.  The pelvis was then carefully examined and the ureters noted away from the operative field. The left IP was triply cauterized and cut. Attention was turned to the fallopian tubes; these were freed from the underlying mesosalpinx and the uterine attachments using the Ligasure device.  The bilateral round and broad ligaments were then clamped and transected with the Ligasure device.  The uterine artery was then skeletonized and a bladder flap was created.  The ureters were noted to be safely away from the area of dissection.  The bladder was then bluntly dissected off the lower uterine segment.    At this point, attention was turned to the uterine vessels, which were clamped and cauterized using the Ligasure on the left, and then the right. After the uterine blood flow at the level of the internal os was controlled, both arteries were cut with the Ligasure.  Good hemostasis was noted overall.  The uterosacral and cardinal ligaments were clamped, cut and ligated bilaterally .  Attention was then turned to the cervicovaginal junction, and monopolar scissors were used to transect the cervix from the surrounding vagina using the ring of the V-care as a guide. This was done circumferentially allowing total hysterectomy.   The  uterus was then removed from the vagina and the vaginal cuff incision was then closed with running 0 Vicryl from below.  Overall excellent hemostasis was noted.    Attention was returned to the abdomen.The ureters were reexamined bilaterally and were pulsating normally. The abdominal pressure was reduced and hemostasis was confirmed.   Intravenous floruoceine was administered, and cystoscopy showed bilateral ureteral jets.  No stitches were visualized in the bladder during cystoscopy. The bladder was hydrodistended with of fluid and held for 8 minutes.   All trocars were removed under direct visualization, and the 10mm port site fascia was closed with 0-vicryl. The abdomen was desufflated.  All skin incisions were closed with 4-0 Vicryl subcuticular stitches and Dermabond. The patient tolerated the procedures well.  All instruments, needles, and sponge counts were correct x 2. The patient was taken to the recovery room awake, extubated and in stable condition.

## 2017-11-14 NOTE — Interval H&P Note (Signed)
History and Physical Interval Note:  11/14/2017 7:19 AM  Kim Curtis  has presented today for surgery, with the diagnosis of Chronic Pelvic Pain  The various methods of treatment have been discussed with the patient and family. After consideration of risks, benefits and other options for treatment, the patient has consented to  Procedure(s): HYSTERECTOMY TOTAL LAPAROSCOPIC (N/A) LAPAROSCOPIC BILATERAL SALPINGECTOMY (Bilateral) Possible LAPAROSCOPIC BILATERAL SALPINGO OOPHORECTOMY (Bilateral) Possible HYSTERECTOMY ABDOMINAL WITH SALPINGECTOMY (Bilateral) as a surgical intervention. The patient's history has been reviewed, patient examined, no change in status, stable for surgery.  I have reviewed the patient's chart and labs.  Questions were answered to the patient's satisfaction.     Christeen DouglasBethany Clotiel Troop

## 2017-11-14 NOTE — Interval H&P Note (Deleted)
History and Physical Interval Note:  11/14/2017 7:17 AM  [name] [name]  has presented today for surgery, with the diagnosis of Chronic Pelvic Pain  The various methods of treatment have been discussed with the patient and family. After consideration of risks, benefits and other options for treatment, the patient has consented to  Procedure(s): HYSTERECTOMY TOTAL LAPAROSCOPIC (N/A) LAPAROSCOPIC BILATERAL SALPINGECTOMY (Bilateral) LAPAROSCOPIC BILATERAL SALPINGO OOPHORECTOMY (Bilateral) HYSTERECTOMY ABDOMINAL WITH SALPINGECTOMY (Bilateral) SALPINGO OOPHORECTOMY (Bilateral) as a surgical intervention .  The patient's history has been reviewed, patient examined, no change in status, stable for surgery.  I have reviewed the patient's chart and labs.  Questions were answered to the patient's satisfaction.     [name]

## 2017-11-14 NOTE — Anesthesia Post-op Follow-up Note (Signed)
Anesthesia QCDR form completed.        

## 2017-11-15 ENCOUNTER — Encounter: Payer: Self-pay | Admitting: Obstetrics and Gynecology

## 2017-11-15 ENCOUNTER — Other Ambulatory Visit: Payer: Self-pay

## 2017-11-15 DIAGNOSIS — R102 Pelvic and perineal pain: Secondary | ICD-10-CM | POA: Diagnosis not present

## 2017-11-15 LAB — BASIC METABOLIC PANEL
ANION GAP: 7 (ref 5–15)
BUN: 8 mg/dL (ref 6–20)
CALCIUM: 8.7 mg/dL — AB (ref 8.9–10.3)
CO2: 24 mmol/L (ref 22–32)
Chloride: 108 mmol/L (ref 101–111)
Creatinine, Ser: 0.75 mg/dL (ref 0.44–1.00)
GFR calc Af Amer: >60 mL/min (ref >60)
GFR calc non Af Amer: >60 mL/min (ref >60)
GLUCOSE: 103 mg/dL — AB (ref 65–99)
Potassium: 3.7 mmol/L (ref 3.5–5.1)
Sodium: 139 mmol/L (ref 135–145)

## 2017-11-15 LAB — CBC
HEMATOCRIT: 31.9 % — AB (ref 35.0–47.0)
Hemoglobin: 11 g/dL — ABNORMAL LOW (ref 12.0–16.0)
MCH: 32.4 pg (ref 26.0–34.0)
MCHC: 34.6 g/dL (ref 32.0–36.0)
MCV: 93.6 fL (ref 80.0–100.0)
Platelets: 188 10*3/uL (ref 150–440)
RBC: 3.4 MIL/uL — ABNORMAL LOW (ref 3.80–5.20)
RDW: 12.9 % (ref 11.5–14.5)
WBC: 11.3 10*3/uL — AB (ref 3.6–11.0)

## 2017-11-15 NOTE — Plan of Care (Signed)
Vs stable; ambulating better in hallway; tolerating regular diet; taking PO pain meds (and "trying to take the pills, but if I need that IV medicine, can I have it?"); RN discussed using IV pain med for just breakthrough pain; patient voiding well; incisions WNL; should be discharged later on 11-15-17

## 2017-11-15 NOTE — Progress Notes (Signed)
Discharge instructions complete and prescriptions given. Patient verbalizes understanding of teaching. Patient discharged home at 1130. 

## 2017-11-15 NOTE — Discharge Summary (Signed)
1 Day Post-Op       Procedure(s): HYSTERECTOMY TOTAL LAPAROSCOPIC (N/A) LAPAROSCOPIC BILATERAL SALPINGECTOMY (Bilateral) LAPAROSCOPIC OOPHORECTOMY (Left) CYSTOSCOPY/HYDRODISTENSION Subjective: The patient is doing well.  No nausea or vomiting. Pain is adequately controlled.  Objective: Vital signs in last 24 hours: Temp:  [97.2 F (36.2 C)-98.8 F (37.1 C)] 98.4 F (36.9 C) (12/29 0752) Pulse Rate:  [99-118] 108 (12/29 0752) Resp:  [9-18] 18 (12/29 0450) BP: (110-139)/(67-94) 139/82 (12/29 0752) SpO2:  [95 %-100 %] 99 % (12/29 0752)  Intake/Output  Intake/Output Summary (Last 24 hours) at 11/15/2017 0940 Last data filed at 11/15/2017 0800 Gross per 24 hour  Intake 2328 ml  Output 1900 ml  Net 428 ml    Physical Exam:  General: Alert and oriented. CV: RRR Lungs: Clear bilaterally. GI: Soft, Nondistended. Incisions: Clean and dry. Urine: Clear, Extremities: Nontender, no erythema, no edema.  Lab Results: Recent Labs    11/14/17 0626 11/15/17 0616  HGB  --  11.0*  HCT  --  31.9*  WBC  --  11.3*  PLT 272 188                 Results for orders placed or performed during the hospital encounter of 11/14/17 (from the past 24 hour(s))  CBC     Status: Abnormal   Collection Time: 11/15/17  6:16 AM  Result Value Ref Range   WBC 11.3 (H) 3.6 - 11.0 K/uL   RBC 3.40 (L) 3.80 - 5.20 MIL/uL   Hemoglobin 11.0 (L) 12.0 - 16.0 g/dL   HCT 16.131.9 (L) 09.635.0 - 04.547.0 %   MCV 93.6 80.0 - 100.0 fL   MCH 32.4 26.0 - 34.0 pg   MCHC 34.6 32.0 - 36.0 g/dL   RDW 40.912.9 81.111.5 - 91.414.5 %   Platelets 188 150 - 440 K/uL  Basic metabolic panel     Status: Abnormal   Collection Time: 11/15/17  6:16 AM  Result Value Ref Range   Sodium 139 135 - 145 mmol/L   Potassium 3.7 3.5 - 5.1 mmol/L   Chloride 108 101 - 111 mmol/L   CO2 24 22 - 32 mmol/L   Glucose, Bld 103 (H) 65 - 99 mg/dL   BUN 8 6 - 20 mg/dL   Creatinine, Ser 7.820.75 0.44 - 1.00 mg/dL   Calcium 8.7 (L) 8.9 - 10.3 mg/dL   GFR calc non  Af Amer >60 >60 mL/min   GFR calc Af Amer >60 >60 mL/min   Anion gap 7 5 - 15    Assessment/Plan: 1 Day Post-Op       Procedure(s): HYSTERECTOMY TOTAL LAPAROSCOPIC (N/A) LAPAROSCOPIC BILATERAL SALPINGECTOMY (Bilateral) LAPAROSCOPIC OOPHORECTOMY (Left) CYSTOSCOPY/HYDRODISTENSION  1) Ambulate, Incentive spirometry 2) Advance diet as tolerated 3) Transition to oral pain medication 5) Discharge home today anticipated    Christeen DouglasBethany Bryler Dibble, MD   LOS: 0 days   Christeen DouglasBethany Oliver Heitzenrater 11/15/2017, 9:40 AM

## 2017-11-17 LAB — SURGICAL PATHOLOGY

## 2017-11-17 NOTE — Addendum Note (Signed)
Addendum  created 11/17/17 0856 by Stormy Fabianurtis, Scarlet Abad, CRNA   Charge Capture section accepted

## 2018-01-05 ENCOUNTER — Ambulatory Visit: Payer: Managed Care, Other (non HMO)

## 2018-01-19 ENCOUNTER — Ambulatory Visit: Payer: Managed Care, Other (non HMO)

## 2018-11-23 ENCOUNTER — Other Ambulatory Visit: Payer: Self-pay | Admitting: Family Medicine

## 2018-11-23 DIAGNOSIS — Z1231 Encounter for screening mammogram for malignant neoplasm of breast: Secondary | ICD-10-CM

## 2018-12-28 ENCOUNTER — Ambulatory Visit
Admission: RE | Admit: 2018-12-28 | Discharge: 2018-12-28 | Disposition: A | Discharge status: Home / Self Care | Payer: Managed Care, Other (non HMO) | Source: Ambulatory Visit | Attending: Family Medicine | Admitting: Family Medicine

## 2018-12-28 DIAGNOSIS — Z1231 Encounter for screening mammogram for malignant neoplasm of breast: Secondary | ICD-10-CM

## 2019-05-19 ENCOUNTER — Encounter: Payer: Self-pay | Admitting: Adult Health

## 2019-05-19 ENCOUNTER — Other Ambulatory Visit: Payer: Self-pay

## 2019-05-19 ENCOUNTER — Ambulatory Visit: Payer: Managed Care, Other (non HMO) | Admitting: Adult Health

## 2019-05-19 VITALS — BP 118/78 | HR 98 | Temp 98.3°F | Resp 16

## 2019-05-19 DIAGNOSIS — R51 Headache: Secondary | ICD-10-CM | POA: Diagnosis not present

## 2019-05-19 DIAGNOSIS — E559 Vitamin D deficiency, unspecified: Secondary | ICD-10-CM | POA: Diagnosis not present

## 2019-05-19 DIAGNOSIS — R519 Headache, unspecified: Secondary | ICD-10-CM

## 2019-05-19 DIAGNOSIS — J0110 Acute frontal sinusitis, unspecified: Secondary | ICD-10-CM | POA: Diagnosis not present

## 2019-05-19 MED ORDER — PREDNISONE 10 MG (21) PO TBPK: ORAL_TABLET | ORAL | 0 refills | Status: AC

## 2019-05-19 MED ORDER — AMOXICILLIN-POT CLAVULANATE 875-125 MG PO TABS: 1.0000 | ORAL_TABLET | Freq: Two times a day (BID) | ORAL | 0 refills | Status: AC

## 2019-05-19 NOTE — Patient Instructions (Addendum)
The sumitriptan you were prescribed 25mg  by the other provider you can take one tablet and may repeat dose x 1 after 2 hours if needed.  I suggest a preventative such as tylenol 1000mg  q 6 hours as needed PRN for 1- 2 weeks.  Avoid ibuprofen while on the prednisone.  We will check labs at today's visit.  Follow up with me in 2 weeks and sooner if needed and any symptoms worsen.  Emergency room if you develop neurological symptoms or a " thunder clap worst headache you have ever had in your life".  Advised patient call the office or your primary care doctor for an appointment if no improvement within 72 hours or if any symptoms change or worsen at any time  Advised ER or urgent Care if after hours or on weekend. Call 911 for emergency symptoms at any time.Patinet verbalized understanding of all instructions given/reviewed and treatment plan and has no further questions or concerns at this time.         General Headache Without Cause A headache is pain or discomfort felt around the head or neck area. The specific cause of a headache may not be found. There are many causes and types of headaches. A few common ones are:  Tension headaches.  Migraine headaches.  Cluster headaches.  Chronic daily headaches. Follow these instructions at home: Watch your condition for any changes. Let your health care provider know about them. Take these steps to help with your condition: Managing pain      Take over-the-counter and prescription medicines only as told by your health care provider.  Lie down in a dark, quiet room when you have a headache.  If directed, put ice on your head and neck area: ? Put ice in a plastic bag. ? Place a towel between your skin and the bag. ? Leave the ice on for 20 minutes, 2-3 times per day.  If directed, apply heat to the affected area. Use the heat source that your health care provider recommends, such as a moist heat pack or a heating pad. ? Place a towel  between your skin and the heat source. ? Leave the heat on for 20-30 minutes. ? Remove the heat if your skin turns bright red. This is especially important if you are unable to feel pain, heat, or cold. You may have a greater risk of getting burned.  Keep lights dim if bright lights bother you or make your headaches worse. Eating and drinking  Eat meals on a regular schedule.  If you drink alcohol: ? Limit how much you use to:  0-1 drink a day for women.  0-2 drinks a day for men. ? Be aware of how much alcohol is in your drink. In the [place] one drink equals one 12 oz bottle of beer (355 mL), one 5 oz glass of wine (148 mL), or one 1 oz glass of hard liquor (44 mL).  Stop drinking caffeine, or decrease the amount of caffeine you drink. General instructions   Keep a headache journal to help find out what may trigger your headaches. For example, write down: ? What you eat and drink. ? How much sleep you get. ? Any change to your diet or medicines.  Try massage or other relaxation techniques.  Limit stress.  Sit up straight, and do not tense your muscles.  Do not use any products that contain nicotine or tobacco, such as cigarettes, e-cigarettes, and chewing tobacco. If you need help quitting, ask  your health care provider.  Exercise regularly as told by your health care provider.  Sleep on a regular schedule. Get 7-9 hours of sleep each night, or the amount recommended by your health care provider.  Keep all follow-up visits as told by your health care provider. This is important. Contact a health care provider if:  Your symptoms are not helped by medicine.  You have a headache that is different from the usual headache.  You have nausea or you vomit.  You have a fever. Get help right away if:  Your headache becomes severe quickly.  Your headache gets worse after moderate to intense physical activity.  You have repeated vomiting.  You have a stiff neck.  You  have a loss of vision.  You have problems with speech.  You have pain in the eye or ear.  You have muscular weakness or loss of muscle control.  You lose your balance or have trouble walking.  You feel faint or pass out.  You have confusion.  You have a seizure. Summary  A headache is pain or discomfort felt around the head or neck area.  There are many causes and types of headaches. In some cases, the cause may not be found.  Keep a headache journal to help find out what may trigger your headaches. Watch your condition for any changes. Let your health care provider know about them.  Contact a health care provider if you have a headache that is different from the usual headache, or if your symptoms are not helped by medicine.  Get help right away if your headache becomes severe, you vomit, you have a loss of vision, you lose your balance, or you have a seizure. This information is not intended to replace advice given to you by your health care provider. Make sure you discuss any questions you have with your health care provider. Document Released: 11/04/2005 Document Revised: 05/25/2018 Document Reviewed: 05/25/2018 Elsevier Patient Education  2020 [employer] Form - Headache Record There are many types and causes of headaches. A headache record can help guide your treatment plan. Use this form to record the details. Bring this form with you to your follow-up visits. Follow your health care provider's instructions on how to describe your headache. You may be asked to:  Use a pain scale. This is a tool to rate the intensity of your headache using words or numbers.  Describe what your headache feels like, such as dull, achy, throbbing, or sharp. Headache record Date: _______________ Time (from start to end): ____________________ Location of the headache: _________________________  Intensity of the headache: ____________________ Description of the headache:  ______________________________________________________________  Hours of sleep the night before the headache: __________  Food or drinks before the headache started: ______________________________________________________________________________________  Events before the headache started: _______________________________________________________________________________________________  Symptoms before the headache started: __________________________________________________________________________________________  Symptoms during the headache: __________________________________________________________________________________________________  Treatment: ________________________________________________________________________________________________________________  Effect of treatment: _________________________________________________________________________________________________________  Other comments: ___________________________________________________________________________________________________________ Date: _______________ Time (from start to end): ____________________ Location of the headache: _________________________  Intensity of the headache: ____________________ Description of the headache: ______________________________________________________________  Hours of sleep the night before the headache: __________  Food or drinks before the headache started: ______________________________________________________________________________________  Events before the headache started: ____________________________________________________________________________________________  Symptoms before the headache started: _________________________________________________________________________________________  Symptoms during the headache: _______________________________________________________________________________________________  Treatment:  ________________________________________________________________________________________________________________  Effect of treatment: _________________________________________________________________________________________________________  Other comments: ___________________________________________________________________________________________________________ Date: _______________ Time (from start to end): ____________________ Location of the headache: _________________________  Intensity of the headache:  ____________________ Description of the headache: ______________________________________________________________  Hours of sleep the night before the headache: __________  Food or drinks before the headache started: ______________________________________________________________________________________  Events before the headache started: ____________________________________________________________________________________________  Symptoms before the headache started: _________________________________________________________________________________________  Symptoms during the headache: _______________________________________________________________________________________________  Treatment: ________________________________________________________________________________________________________________  Effect of treatment: _________________________________________________________________________________________________________  Other comments: ___________________________________________________________________________________________________________ Date: _______________ Time (from start to end): ____________________ Location of the headache: _________________________  Intensity of the headache: ____________________ Description of the headache: ______________________________________________________________  Hours of sleep the night before the headache: _________  Food or drinks  before the headache started: ______________________________________________________________________________________  Events before the headache started: ____________________________________________________________________________________________  Symptoms before the headache started: _________________________________________________________________________________________  Symptoms during the headache: _______________________________________________________________________________________________  Treatment: ________________________________________________________________________________________________________________  Effect of treatment: _________________________________________________________________________________________________________  Other comments: ___________________________________________________________________________________________________________ Date: _______________ Time (from start to end): ____________________ Location of the headache: _________________________  Intensity of the headache: ____________________ Description of the headache: ______________________________________________________________  Hours of sleep the night before the headache: _________  Food or drinks before the headache started: ______________________________________________________________________________________  Events before the headache started: ____________________________________________________________________________________________  Symptoms before the headache started: _________________________________________________________________________________________  Symptoms during the headache: _______________________________________________________________________________________________  Treatment: ________________________________________________________________________________________________________________  Effect of treatment:  _________________________________________________________________________________________________________  Other comments: ___________________________________________________________________________________________________________ This information is not intended to replace advice given to you by your health care provider. Make sure you discuss any questions you have with your health care provider. Document Released: 11/23/2018 Document Revised: 11/23/2018 Document Reviewed: 11/23/2018 Elsevier Patient Education  2020 [employer] Sumatriptan tablets What is this medicine? SUMATRIPTAN (soo ma TRIP tan) is used to treat migraines with or without aura. An aura is a strange feeling or visual disturbance that warns you of an attack. It is not used to prevent migraines. This medicine may be used for other purposes; ask your health care provider or pharmacist if you have questions. COMMON BRAND NAME(S): Imitrex, Migraine Pack What should I tell my health care provider before I take this medicine? They need to know if you have any of these conditions:  cigarette smoker  circulation problems in fingers and toes  diabetes  heart disease  high blood pressure  high cholesterol  history of irregular heartbeat  history of stroke  kidney disease  liver disease  stomach or intestine problems  an unusual or allergic reaction to sumatriptan, other medicines, foods, dyes, or preservatives  pregnant or trying to get pregnant  breast-feeding How should I use this medicine? Take this medicine by mouth with a glass of water. Follow the directions on the prescription label. Do not take it more often than directed. Talk to your pediatrician regarding the use of this medicine in children. Special care may be needed. Overdosage: If you think you have taken too much of this medicine contact a poison control center or emergency room at once. NOTE: This medicine is only for you. Do not share this  medicine with others. What if I miss a dose? This does not apply. This medicine is not for regular use. What may interact with this medicine? Do not take this medicine with any of the following medicines:  certain medicines for migraine headache like almotriptan, eletriptan, frovatriptan, naratriptan, rizatriptan, sumatriptan, zolmitriptan  ergot alkaloids like dihydroergotamine, ergonovine, ergotamine, methylergonovine  MAOIs like Carbex, Eldepryl, Marplan, Nardil, and Parnate This medicine may also interact with the following medications:  certain medicines for depression, anxiety, or psychotic disorders This list may not describe all possible interactions. Give your health care provider a list of all the medicines, herbs, non-prescription drugs,  or dietary supplements you use. Also tell them if you smoke, drink alcohol, or use illegal drugs. Some items may interact with your medicine. What should I watch for while using this medicine? Visit your healthcare professional for regular checks on your progress. Tell your healthcare professional if your symptoms do not start to get better or if they get worse. You may get drowsy or dizzy. Do not drive, use machinery, or do anything that needs mental alertness until you know how this medicine affects you. Do not stand up or sit up quickly, especially if you are an older patient. This reduces the risk of dizzy or fainting spells. Alcohol may interfere with the effect of this medicine. Tell your healthcare professional right away if you have any change in your eyesight. If you take migraine medicines for 10 or more days a month, your migraines may get worse. Keep a diary of headache days and medicine use. Contact your healthcare professional if your migraine attacks occur more frequently. What side effects may I notice from receiving this medicine? Side effects that you should report to your doctor or health care professional as soon as possible:   allergic reactions like skin rash, itching or hives, swelling of the face, lips, or tongue  changes in vision  chest pain or chest tightness  signs and symptoms of a dangerous change in heartbeat or heart rhythm like chest pain; dizziness; fast, irregular heartbeat; palpitations; feeling faint or lightheaded; falls; breathing problems  signs and symptoms of a stroke like changes in vision; confusion; trouble speaking or understanding; severe headaches; sudden numbness or weakness of the face, arm or leg; trouble walking; dizziness; loss of balance or coordination  signs and symptoms of serotonin syndrome like irritable; confusion; diarrhea; fast or irregular heartbeat; muscle twitching; stiff muscles; trouble walking; sweating; high fever; seizures; chills; vomiting Side effects that usually do not require medical attention (report to your doctor or health care professional if they continue or are bothersome):  diarrhea  dizziness  drowsiness  dry mouth  headache  nausea, vomiting  pain, tingling, numbness in the hands or feet  stomach pain This list may not describe all possible side effects. Call your doctor for medical advice about side effects. You may report side effects to FDA at 1-800-FDA-1088. Where should I keep my medicine? Keep out of the reach of children. Store at room temperature between 2 and 30 degrees C (36 and 86 degrees F). Throw away any unused medicine after the expiration date. NOTE: This sheet is a summary. It may not cover all possible information. If you have questions about this medicine, talk to your doctor, pharmacist, or health care provider.  2020 Elsevier/Gold Standard (2018-05-19 15:05:37)  Amoxicillin; Clavulanic Acid tablets What is this medicine? AMOXICILLIN; CLAVULANIC ACID (a mox i SIL in; KLAV yoo lan ic AS id) is a penicillin antibiotic. It is used to treat certain kinds of bacterial infections. It will not work for colds, flu, or other  viral infections. This medicine may be used for other purposes; ask your health care provider or pharmacist if you have questions. COMMON BRAND NAME(S): Augmentin What should I tell my health care provider before I take this medicine? They need to know if you have any of these conditions:  bowel disease, like colitis  kidney disease  liver disease  mononucleosis  an unusual or allergic reaction to amoxicillin, penicillin, cephalosporin, other antibiotics, clavulanic acid, other medicines, foods, dyes, or preservatives  pregnant or trying to get pregnant  breast-feeding How should I use this medicine? Take this medicine by mouth with a full glass of water. Follow the directions on the prescription label. Take at the start of a meal. Do not crush or chew. If the tablet has a score line, you may cut it in half at the score line for easier swallowing. Take your medicine at regular intervals. Do not take your medicine more often than directed. Take all of your medicine as directed even if you think you are better. Do not skip doses or stop your medicine early. Talk to your pediatrician regarding the use of this medicine in children. Special care may be needed. Overdosage: If you think you have taken too much of this medicine contact a poison control center or emergency room at once. NOTE: This medicine is only for you. Do not share this medicine with others. What if I miss a dose? If you miss a dose, take it as soon as you can. If it is almost time for your next dose, take only that dose. Do not take double or extra doses. What may interact with this medicine?  allopurinol  anticoagulants  birth control pills  methotrexate  probenecid This list may not describe all possible interactions. Give your health care provider a list of all the medicines, herbs, non-prescription drugs, or dietary supplements you use. Also tell them if you smoke, drink alcohol, or use illegal drugs. Some items  may interact with your medicine. What should I watch for while using this medicine? Tell your doctor or healthcare provider if your symptoms do not improve. This medicine may cause serious skin reactions. They can happen weeks to months after starting the medicine. Contact your healthcare provider right away if you notice fevers or flu-like symptoms with a rash. The rash may be red or purple and then turn into blisters or peeling of the skin. Or, you might notice a red rash with swelling of the face, lips or lymph nodes in your neck or under your arms. Do not treat diarrhea with over the counter products. Contact your doctor if you have diarrhea that lasts more than 2 days or if it is severe and watery. If you have diabetes, you may get a false-positive result for sugar in your urine. Check with your doctor or healthcare provider. Birth control pills may not work properly while you are taking this medicine. Talk to your doctor about using an extra method of birth control. What side effects may I notice from receiving this medicine? Side effects that you should report to your doctor or health care professional as soon as possible:  allergic reactions like skin rash, itching or hives, swelling of the face, lips, or tongue  breathing problems  dark urine  fever or chills, sore throat  redness, blistering, peeling, or loosening of the skin, including inside the mouth  seizures  trouble passing urine or change in the amount of urine  unusual bleeding, bruising  unusually weak or tired  white patches or sores in the mouth or throat Side effects that usually do not require medical attention (report to your doctor or health care professional if they continue or are bothersome):  diarrhea  dizziness  headache  nausea, vomiting  stomach upset  vaginal or anal irritation This list may not describe all possible side effects. Call your doctor for medical advice about side effects. You  may report side effects to FDA at 1-800-FDA-1088. Where should I keep my medicine? Keep out of the reach  of children. Store at room temperature below 25 degrees C (77 degrees F). Keep container tightly closed. Throw away any unused medicine after the expiration date. NOTE: This sheet is a summary. It may not cover all possible information. If you have questions about this medicine, talk to your doctor, pharmacist, or health care provider.  2020 Elsevier/Gold Standard (2019-01-18 09:43:46)  Sinus Headache  A sinus headache happens when your sinuses get swollen or blocked (clogged). Sinuses are spaces behind the bones of your face and forehead. You may feel pain or pressure in your face, forehead, ears, or upper teeth. Sinus headaches can be mild or very bad. Follow these instructions at home: General instructions  If told: ? Apply a warm, moist washcloth to your face. This can help to lessen pain. ? Use a nasal saline wash. Follow the directions on the bottle or box. Medicines   Take over-the-counter and prescription medicines only as told by your doctor.  If you were prescribed an antibiotic medicine, take it as told by your doctor. Do not stop taking it even if you start to feel better.  Use a nose spray if your nose feels full of mucus (congested). Hydrate and humidify  Drink enough water to keep your pee (urine) pale yellow.  Use a cool mist humidifier to keep the humidity level in your home above 50%.  Breathe in steam for 10-15 minutes, 3-4 times a day or as told by your doctor. You can do this in the bathroom while a hot shower is running.  Try not to spend time in cool or dry air. Contact a doctor if:  You get more than one headache a week.  Light or sound bothers you.  You have a fever.  You feel sick to your stomach (nauseous) or you throw up (vomit).  Your headaches do not get better with treatment. Get help right away if:  You have trouble seeing.  You  suddenly have very bad pain in your face or head.  You start to have quick, sudden movements or shaking that you cannot control (seizure).  You are confused.  You have a stiff neck. Summary  A sinus headache happens when your sinuses get swollen or blocked (clogged). Sinuses are spaces behind the bones of your face and forehead.  You may feel pain or pressure in your face, forehead, ears, or upper teeth.  Take over-the-counter and prescription medicines only as told by your doctor.  If told, apply a warm, moist washcloth to your face. This can help to lessen pain. This information is not intended to replace advice given to you by your health care provider. Make sure you discuss any questions you have with your health care provider. Document Released: 03/06/2011 Document Revised: 10/17/2017 Document Reviewed: 08/15/2017 Elsevier Patient Education  2020 [employer]

## 2019-05-19 NOTE — Progress Notes (Addendum)
[facility]  Subjective:     Patient ID: Kim Curtis, female   DOB: [birth year], 43 y.o.   MRN: [id number]  Allergies  Allergen Reactions  . Latex Itching   Patient Active Problem List   Diagnosis Date Noted  . Pelvic pain in female 11/14/2017   Patient is asymptomatic at visit - denies any headache today or any other symptoms.   Patient  denies any fever, body aches,chills, rash, chest pain, shortness of breath, nausea, vomiting, or diarrhea.    Subjective:  Kim Curtis is a 43 y.o. female who presents for evaluation of headache. Symptoms began about 1.5  week ago. Generally, the headaches last about 4 hours and occur every evening. The headaches are usually worse in the afternoon start like clockwork at 3pm.. The headaches are usually dull and squeezing and are located in the frontal and .  The patient rates her most severe headaches a 8 on a scale from 1 to 10. Recently, the headaches have been stable. Work attendance or other daily activities are affected by the headaches. Precipitating factors include: none which have been determined. The headaches are usually not preceded by an aura. Associated neurologic symptoms: none . The patient denies depression, dizziness, loss of balance, muscle weakness, numbness of extremities, speech difficulties, vision problems, vomiting in the early morning and worsening school/work performance. Home treatment has included ibuprofen and Excedrin migraine  resting and sleeping with little improvement. Other history includes: nothing pertinent. Family history includes no known family members with significant headaches.  She denies any aura or any neurological symptoms.   She received Sumatriptan after a virtual visit with a provider on 06/30/20She tried 25 mg of sumatriptan and she gained relief after 3 hours.   B12 injections once a month. Cymbalta over two years,   The following portions of the patient's  history were reviewed and updated as appropriate: past social history and past surgical history.  Review of Systems Constitutional: negative except for mild fatigue  she states she " has three children and they keep her busy so fatigue is my normal "  Eyes: negative Ears, nose, mouth, throat, and face: positive for nasal congestion- mild at time " allergies"  Respiratory: negative Cardiovascular: negative Gastrointestinal: negative Genitourinary:negative Hematologic/lymphatic: positive for B 12 deficiency took B 12 injection around two weeks ago- denies any headache in past with it.  Musculoskeletal:negative Neurological: positive for headaches the past 1 to 1.5 weeks per patient, occurring around 3 pm. She denies any previous headaches this consistent, she has had headaches in past. She denies any of these symptoms prior to 1 to 1.5 weeks ago. Frontal lobe headache. She denies any vision changes. She does wear glasses.  Behavioral/Psych: negative Endocrine: negative Allergic/Immunologic: negative   Objective:  Physical Exam  Patient is alert and oriented and responsive to questions Engages in eye contact with provider. Speaks in full sentences without any pauses without any shortness of breath or distress.  HEENT: mild frontal tenderness with palpation,bilateral ears normal internal and external,  mild nasal edema, red nasal nares bilaterally. PERRLA, peripheral vision normal, neck supple, no lymphadenopathy cervical noted. Nasal stuffiness heard in room.  Thyroid Normal Heart: regular rate regular rhythm.  Lungs clear to auscultation no adventitious lung sounds.  Abdomen : soft, non tender  Neuro : No nystagmus, heel to toe walk normal, Romberg negative, gait sure and steady Muscle/ Skeletal : No pain, normal range of motion Skin: no rash  Assessment:  Sinusitis, acute  Will treat since she does have a frontal headache. Will do short burst of prednisone dose pack. Discontinue  Motrin and Diclofenac while on Prednisone due to increased  risk kidney injury and or gastrointestinal bleeding.   Headache for the past 1 to 1.5 weeks occurring most days around 3pm - unknown cause or trigger, keep headache journal discussed sleep hygiene, diet contributors and other causes. No aura. She did get relief with one 25 mg  Sumatriptan on 05/18/19 after a visit with a doctor on her insurance virtually.     Plan: Will check labs Orders Placed This Encounter     Vitamin B12     VITAMIN D 25 Hydroxy (Vit-D Deficiency, Fractures)     CMP12+LP+TP+TSH+6AC+CBC/D/Plt   Continue present treatment and plan. Lie in darkened room and apply cold packs as needed for pain. Side effect profile discussed in detail. Asked to keep headache diary. Avoid alcohol, aspirin and OTC NSAIDs, caffeine, excessive physical activity, heavy lifting, smoking, spicy foods and stressful situations as much as possible. Patient reassured that neurodiagnostic workup not indicated from benign H&P. Follow up in 2 weeks and sooner if needed or any symptoms worsen or change at any time. Discussed emergency symptoms, neurological symptoms, and if any " thunder clap headache or worst headache of life, or no relief with medication seek emergency medical care immediately.   Orders Placed This Encounter     predniSONE (STERAPRED UNI-PAK 21 TAB) 10 MG (21) TBPK tablet         Sig: PO: Take 6 tablets on day 1:Take 5 tablets day 2:Take 4 tablets day 3: Take 3 tablets day 4:Take 2 tablets day five: 5 Take 1 tablet day 6         Dispense:  21 tablet         Refill:  0     amoxicillin-clavulanate (AUGMENTIN) 875-125 MG tablet         Sig: Take 1 tablet by mouth 2 (two) times daily.         Dispense:  14 tablet         Refill:  0  Also recommend Tylenol 100 mg  6 hours as needed for headache preventative for 1- 2 weeks - not for long term use.  She has not been to her PCP for labs she reports - due to Covid 19 pandemic will  check labs and she will be expected to follow up with her primary care for routine physical and care. Follow up with primary care as needed for chronic and maintenance health care- can be seen in this employee clinic for acute care.   Patient verbalized understanding of all instructions given and denies any further questions at this time.

## 2019-05-20 LAB — CMP12+LP+TP+TSH+6AC+CBC/D/PLT
ALT: 31 IU/L (ref 0–32)
AST: 21 IU/L (ref 0–40)
Albumin/Globulin Ratio: 1.8 (ref 1.2–2.2)
Albumin: 4.6 g/dL (ref 3.8–4.8)
Alkaline Phosphatase: 101 IU/L (ref 39–117)
BUN/Creatinine Ratio: 21 (ref 9–23)
BUN: 16 mg/dL (ref 6–24)
Basophils Absolute: 0.1 10*3/uL (ref 0.0–0.2)
Basos: 1 %
Bilirubin Total: 0.3 mg/dL (ref 0.0–1.2)
Calcium: 9.4 mg/dL (ref 8.7–10.2)
Chloride: 100 mmol/L (ref 96–106)
Chol/HDL Ratio: 7.3 ratio — ABNORMAL HIGH (ref 0.0–4.4)
Cholesterol, Total: 300 mg/dL — ABNORMAL HIGH (ref 100–199)
Creatinine, Ser: 0.78 mg/dL (ref 0.57–1.00)
EOS (ABSOLUTE): 0.7 10*3/uL — ABNORMAL HIGH (ref 0.0–0.4)
Eos: 10 %
Estimated CHD Risk: 2.1 times avg. — ABNORMAL HIGH (ref 0.0–1.0)
Free Thyroxine Index: 1.5 (ref 1.2–4.9)
GFR calc Af Amer: 109 mL/min/{1.73_m2} (ref >59)
GFR calc non Af Amer: 95 mL/min/{1.73_m2} (ref >59)
GGT: 17 IU/L (ref 0–60)
Globulin, Total: 2.5 g/dL (ref 1.5–4.5)
Glucose: 88 mg/dL (ref 65–99)
HDL: 41 mg/dL (ref >39)
Hematocrit: 38.6 % (ref 34.0–46.6)
Hemoglobin: 13.4 g/dL (ref 11.1–15.9)
Immature Grans (Abs): 0 10*3/uL (ref 0.0–0.1)
Immature Granulocytes: 0 %
Iron: 110 ug/dL (ref 27–159)
LDH: 168 IU/L (ref 119–226)
LDL Calculated: 208 mg/dL — ABNORMAL HIGH (ref 0–99)
Lymphocytes Absolute: 1.7 10*3/uL (ref 0.7–3.1)
Lymphs: 22 %
MCH: 32.6 pg (ref 26.6–33.0)
MCHC: 34.7 g/dL (ref 31.5–35.7)
MCV: 94 fL (ref 79–97)
Monocytes Absolute: 0.5 10*3/uL (ref 0.1–0.9)
Monocytes: 6 %
Neutrophils Absolute: 4.5 10*3/uL (ref 1.4–7.0)
Neutrophils: 61 %
Phosphorus: 3.6 mg/dL (ref 3.0–4.3)
Potassium: 4.6 mmol/L (ref 3.5–5.2)
RBC: 4.11 x10E6/uL (ref 3.77–5.28)
RDW: 11.9 % (ref 11.7–15.4)
Sodium: 139 mmol/L (ref 134–144)
T3 Uptake Ratio: 21 % — ABNORMAL LOW (ref 24–39)
T4, Total: 7.2 ug/dL (ref 4.5–12.0)
TSH: 2.11 u[IU]/mL (ref 0.450–4.500)
Total Protein: 7.1 g/dL (ref 6.0–8.5)
Triglycerides: 255 mg/dL — ABNORMAL HIGH (ref 0–149)
Uric Acid: 3.6 mg/dL (ref 2.5–7.1)
VLDL Cholesterol Cal: 51 mg/dL — ABNORMAL HIGH (ref 5–40)
WBC: 7.5 10*3/uL (ref 3.4–10.8)

## 2019-05-20 LAB — VITAMIN B12: Vitamin B-12: 837 pg/mL (ref 232–1245)

## 2019-05-20 LAB — VITAMIN D 25 HYDROXY (VIT D DEFICIENCY, FRACTURES): Vit D, 25-Hydroxy: 20.4 ng/mL — ABNORMAL LOW (ref 30.0–100.0)

## 2019-05-20 NOTE — Progress Notes (Signed)
Kim Curtis, will you let patinet know if she calls back. If not you can try her Monday.  Lab results: tried calling patient left message to return call to the office on 05/20/19 at 830 am.  Platelets were aggregated and did not result  Vitamin D is low will be happy to call in Vitamin D 50,000 international units to take once weekly for 8 weeks. Then suggest recheck of lab after the 8th week with primary care.  Please check to see if patient was totally fasting - no cream or foods etc before visit- if she was her cholesterol is very high at 300 (normal 100-199), triglycerides are also elevated 255( normal is 0-149) LDL is 208 this is her bad cholesterol and normal is 0-99. If she was truly fasting this puts her at higher risk for cardiovascular disease and I recommend prompt follow up with her primary care Kim Pink, MD and extreme lifestyle and diet changes. If she was not fasting- she should have a repeat lipid panel. Vitamin B12 is normal and anemia resolved.  Eosinophils are mildly elevated this is usually due to allergies/allergy- I recommend starting Zyrtec 10 mg daily and Flonase nasal spray peer package instructions. if she is not taking it or an equivalent such as Allegra or Claritin over the counter per package instructions. Please let me know should she have any questions.

## 2019-05-24 MED ORDER — VITAMIN D (ERGOCALCIFEROL) 1.25 MG (50000 UNIT) PO CAPS: 50000.0000 [IU] | ORAL_CAPSULE | ORAL | Status: AC

## 2019-05-24 NOTE — Addendum Note (Signed)
Addended by: Judie Petit on: 05/24/2019 09:26 AM   Modules accepted: Orders

## 2020-02-08 ENCOUNTER — Other Ambulatory Visit: Payer: Self-pay | Admitting: Family Medicine

## 2020-02-08 DIAGNOSIS — Z1231 Encounter for screening mammogram for malignant neoplasm of breast: Secondary | ICD-10-CM

## 2020-04-04 ENCOUNTER — Ambulatory Visit
Admission: RE | Admit: 2020-04-04 | Discharge: 2020-04-04 | Disposition: A | Discharge status: Home / Self Care | Payer: Managed Care, Other (non HMO) | Source: Ambulatory Visit | Attending: Family Medicine | Admitting: Family Medicine

## 2020-04-04 ENCOUNTER — Other Ambulatory Visit: Payer: Self-pay

## 2020-04-04 DIAGNOSIS — Z1231 Encounter for screening mammogram for malignant neoplasm of breast: Secondary | ICD-10-CM

## 2021-04-12 ENCOUNTER — Other Ambulatory Visit: Payer: Self-pay | Admitting: Family Medicine

## 2021-04-12 DIAGNOSIS — Z1231 Encounter for screening mammogram for malignant neoplasm of breast: Secondary | ICD-10-CM

## 2021-04-24 ENCOUNTER — Other Ambulatory Visit: Payer: Self-pay

## 2021-04-24 ENCOUNTER — Ambulatory Visit: Payer: Managed Care, Other (non HMO)

## 2021-08-02 ENCOUNTER — Encounter: Payer: Self-pay | Admitting: Emergency Medicine

## 2021-08-02 ENCOUNTER — Other Ambulatory Visit: Payer: Self-pay

## 2021-08-02 ENCOUNTER — Emergency Department: Payer: BLUE CROSS/BLUE SHIELD

## 2021-08-02 ENCOUNTER — Emergency Department
Admission: EM | Admit: 2021-08-02 | Discharge: 2021-08-02 | Disposition: A | Discharge status: Home / Self Care | Payer: BLUE CROSS/BLUE SHIELD | Attending: Emergency Medicine | Admitting: Emergency Medicine

## 2021-08-02 DIAGNOSIS — J45998 Other asthma: Secondary | ICD-10-CM

## 2021-08-02 DIAGNOSIS — B349 Viral infection, unspecified: Secondary | ICD-10-CM | POA: Diagnosis not present

## 2021-08-02 DIAGNOSIS — Z91040 Latex allergy status: Secondary | ICD-10-CM | POA: Diagnosis not present

## 2021-08-02 DIAGNOSIS — J45909 Unspecified asthma, uncomplicated: Secondary | ICD-10-CM | POA: Diagnosis not present

## 2021-08-02 DIAGNOSIS — R1011 Right upper quadrant pain: Secondary | ICD-10-CM | POA: Diagnosis present

## 2021-08-02 DIAGNOSIS — Z7951 Long term (current) use of inhaled steroids: Secondary | ICD-10-CM | POA: Insufficient documentation

## 2021-08-02 DIAGNOSIS — Z20822 Contact with and (suspected) exposure to covid-19: Secondary | ICD-10-CM | POA: Diagnosis not present

## 2021-08-02 DIAGNOSIS — R0789 Other chest pain: Secondary | ICD-10-CM | POA: Diagnosis not present

## 2021-08-02 LAB — COMPREHENSIVE METABOLIC PANEL
ALT: 21 U/L (ref 0–44)
AST: 16 U/L (ref 15–41)
Albumin: 3.7 g/dL (ref 3.5–5.0)
Alkaline Phosphatase: 87 U/L (ref 38–126)
Anion gap: 8 (ref 5–15)
BUN: 13 mg/dL (ref 6–20)
CO2: 24 mmol/L (ref 22–32)
Calcium: 8.8 mg/dL — ABNORMAL LOW (ref 8.9–10.3)
Chloride: 106 mmol/L (ref 98–111)
Creatinine, Ser: 0.73 mg/dL (ref 0.44–1.00)
GFR, Estimated: >60 mL/min (ref >60)
Glucose, Bld: 110 mg/dL — ABNORMAL HIGH (ref 70–99)
Potassium: 3.6 mmol/L (ref 3.5–5.1)
Sodium: 138 mmol/L (ref 135–145)
Total Bilirubin: 0.5 mg/dL (ref 0.3–1.2)
Total Protein: 6.9 g/dL (ref 6.5–8.1)

## 2021-08-02 LAB — URINALYSIS, ROUTINE W REFLEX MICROSCOPIC
Bilirubin Urine: NEGATIVE
Glucose, UA: NEGATIVE mg/dL
Hgb urine dipstick: NEGATIVE
Ketones, ur: NEGATIVE mg/dL
Leukocytes,Ua: NEGATIVE
Nitrite: NEGATIVE
Protein, ur: NEGATIVE mg/dL
Specific Gravity, Urine: 1.025 (ref 1.005–1.030)
pH: 5 (ref 5.0–8.0)

## 2021-08-02 LAB — CBC WITH DIFFERENTIAL/PLATELET
Abs Immature Granulocytes: 0.04 10*3/uL (ref 0.00–0.07)
Basophils Absolute: 0 10*3/uL (ref 0.0–0.1)
Basophils Relative: 0 %
Eosinophils Absolute: 0.3 10*3/uL (ref 0.0–0.5)
Eosinophils Relative: 3 %
HCT: 35.2 % — ABNORMAL LOW (ref 36.0–46.0)
Hemoglobin: 12.5 g/dL (ref 12.0–15.0)
Immature Granulocytes: 0 %
Lymphocytes Relative: 17 %
Lymphs Abs: 2.1 10*3/uL (ref 0.7–4.0)
MCH: 33.2 pg (ref 26.0–34.0)
MCHC: 35.5 g/dL (ref 30.0–36.0)
MCV: 93.6 fL (ref 80.0–100.0)
Monocytes Absolute: 0.8 10*3/uL (ref 0.1–1.0)
Monocytes Relative: 6 %
Neutro Abs: 8.9 10*3/uL — ABNORMAL HIGH (ref 1.7–7.7)
Neutrophils Relative %: 74 %
Platelets: 214 10*3/uL (ref 150–400)
RBC: 3.76 MIL/uL — ABNORMAL LOW (ref 3.87–5.11)
RDW: 12 % (ref 11.5–15.5)
WBC: 12.1 10*3/uL — ABNORMAL HIGH (ref 4.0–10.5)
nRBC: 0 % (ref 0.0–0.2)

## 2021-08-02 LAB — RESPIRATORY PANEL BY PCR

## 2021-08-02 LAB — LIPASE, BLOOD: Lipase: 28 U/L (ref 11–51)

## 2021-08-02 LAB — RESP PANEL BY RT-PCR (FLU A&B, COVID) ARPGX2
Influenza A by PCR: NEGATIVE
Influenza B by PCR: NEGATIVE
SARS Coronavirus 2 by RT PCR: NEGATIVE

## 2021-08-02 LAB — TROPONIN I (HIGH SENSITIVITY)
Troponin I (High Sensitivity): 6 ng/L (ref <18)
Troponin I (High Sensitivity): 6 ng/L (ref <18)

## 2021-08-02 MED ORDER — ONDANSETRON HCL 4 MG/2ML IJ SOLN
4.0000 mg | INTRAMUSCULAR | Status: AC
  Administered 2021-08-02: 4 mg via INTRAVENOUS
  Filled 2021-08-02: qty 2

## 2021-08-02 MED ORDER — IOHEXOL 350 MG/ML SOLN
80.0000 mL | Freq: Once | INTRAVENOUS | Status: AC | PRN
  Administered 2021-08-02: 80 mL via INTRAVENOUS

## 2021-08-02 MED ORDER — ONDANSETRON 4 MG PO TBDP: 4.0000 mg | ORAL_TABLET | Freq: Four times a day (QID) | ORAL | 0 refills | Status: AC | PRN

## 2021-08-02 MED ORDER — MORPHINE SULFATE (PF) 4 MG/ML IV SOLN
4.0000 mg | Freq: Once | INTRAVENOUS | Status: AC
  Administered 2021-08-02: 4 mg via INTRAVENOUS
  Filled 2021-08-02: qty 1

## 2021-08-02 MED ORDER — SODIUM CHLORIDE 0.9 % IV BOLUS
1000.0000 mL | Freq: Once | INTRAVENOUS | Status: AC
  Administered 2021-08-02: 1000 mL via INTRAVENOUS

## 2021-08-02 MED ORDER — KETOROLAC TROMETHAMINE 30 MG/ML IJ SOLN
15.0000 mg | Freq: Once | INTRAMUSCULAR | Status: AC
  Administered 2021-08-02: 15 mg via INTRAVENOUS
  Filled 2021-08-02: qty 1

## 2021-08-02 MED ORDER — KETOROLAC TROMETHAMINE 10 MG PO TABS: 10.0000 mg | ORAL_TABLET | Freq: Four times a day (QID) | ORAL | 0 refills | Status: AC | PRN

## 2021-08-02 NOTE — ED Notes (Signed)
Urine sent to lab incase a UA is ordered.

## 2021-08-02 NOTE — ED Triage Notes (Signed)
Patient ambulatory to triage with steady gait, without difficulty or distress noted; pt reports rt upper abd pain radiating into neck accomp by [facility]; denies hx of same

## 2021-08-02 NOTE — ED Notes (Signed)
Patient transported to CT 

## 2021-08-02 NOTE — Discharge Instructions (Addendum)
Please return right away if your pain is uncontrolled severe or new concerns arise.  At this point we are not quite certain as to the cause of your upper abdominal pain but I do recommend close follow-up with cardiologist, your pulmonologist, and also your primary care doctor.  Return to the ER if you develop a fever difficulty breathing, severe pain, worsening new or concerning pain, weakness, or other new concerns arise.

## 2021-08-02 NOTE — ED Notes (Signed)
Pt discharged by [facility].

## 2021-08-02 NOTE — ED Notes (Signed)
Patient transported to Ultrasound 

## 2021-08-02 NOTE — ED Provider Notes (Signed)
[facility] Emergency Department Provider Note   ____________________________________________   Event Date/Time   First MD Initiated Contact with Patient 08/02/21 0831     (approximate)  I have reviewed the triage vital signs and the nursing notes.   HISTORY  Chief Complaint Abdominal Pain    HPI Kim Curtis is a 45 y.o. female history of previous hysterectomy asthma  Patient noticed since last evening pain located high in her right upper abdomen.  Somewhat present since the afternoon.  Pain is gradually been worsening  Currently located mostly in the right upper abdomen but does radiate some discomfort and pain towards her right mid to upper chest.  No lower abdominal pain.  No urinary symptoms.  No vaginal symptoms.  No nausea vomiting or fevers.  Pain is persisted throughout the last almost 24 hours   Past Medical History:  Diagnosis Date   Asthma    allergic   History of kidney stones    Joint pain     Patient Active Problem List   Diagnosis Date Noted   Pelvic pain in female 11/14/2017    Past Surgical History:  Procedure Laterality Date   BREAST EXCISIONAL BIOPSY Right 2009   benign   BREAST SURGERY     right breast biopsy/ benign   CYSTO WITH HYDRODISTENSION  11/14/2017   Procedure: CYSTOSCOPY/HYDRODISTENSION;  Surgeon: Christeen Douglas, MD;  Location: [facility] ORS;  Service: Gynecology;;   LAPAROSCOPIC BILATERAL SALPINGECTOMY Bilateral 11/14/2017   Procedure: LAPAROSCOPIC BILATERAL SALPINGECTOMY;  Surgeon: Christeen Douglas, MD;  Location: [facility] ORS;  Service: Gynecology;  Laterality: Bilateral;   LAPAROSCOPIC HYSTERECTOMY N/A 11/14/2017   Procedure: HYSTERECTOMY TOTAL LAPAROSCOPIC;  Surgeon: Christeen Douglas, MD;  Location: [facility] ORS;  Service: Gynecology;  Laterality: N/A;    Prior to Admission medications   Medication Sig Start Date End Date Taking? Authorizing Provider  ketorolac (TORADOL) 10 MG tablet Take 1 tablet (10 mg  total) by mouth every 6 (six) hours as needed for severe pain or moderate pain. 08/02/21  Yes Sharyn Creamer, MD  ondansetron (ZOFRAN ODT) 4 MG disintegrating tablet Take 1 tablet (4 mg total) by mouth every 6 (six) hours as needed for nausea or vomiting. 08/02/21  Yes Sharyn Creamer, MD  albuterol (PROVENTIL HFA;VENTOLIN HFA) 108 (90 Base) MCG/ACT inhaler Inhale 2 puffs into the lungs every 6 (six) hours as needed for wheezing or shortness of breath.    [provider]  amoxicillin-clavulanate (AUGMENTIN) 875-125 MG tablet Take 1 tablet by mouth 2 (two) times daily. 05/19/19   Flinchum, Eula Fried, FNP  budesonide-formoterol (SYMBICORT) 160-4.5 MCG/ACT inhaler Inhale 2 puffs into the lungs 2 (two) times daily. 12/18/16   [provider]  cyanocobalamin (,VITAMIN B-12,) 1000 MCG/ML injection Inject 1,000 mcg into the muscle once.    [provider]  diclofenac (VOLTAREN) 75 MG EC tablet Take 1 tablet (75 mg total) by mouth 2 (two) times daily. 07/25/15   Vivi Barrack, DPM  DULoxetine (CYMBALTA) 30 MG capsule Take 30 mg by mouth once daily 02/12/17   [provider]  montelukast (SINGULAIR) 10 MG tablet  12/09/18   [provider]  predniSONE (STERAPRED UNI-PAK 21 TAB) 10 MG (21) TBPK tablet PO: Take 6 tablets on day 1:Take 5 tablets day 2:Take 4 tablets day 3: Take 3 tablets day 4:Take 2 tablets day five: 5 Take 1 tablet day 6 05/19/19   Flinchum, Eula Fried, FNP  SUMAtriptan (IMITREX) 25 MG tablet Take 25 mg by mouth  every 2 (two) hours as needed for migraine. May repeat in 2 hours if headache persists or recurs. 05/18/19   [provider]    Allergies Latex  Family History  Problem Relation Age of Onset   Breast cancer Mother     Social History Social History   Tobacco Use   Smoking status: Never   Smokeless tobacco: Never  Vaping Use   Vaping Use: Never used  Substance Use Topics   Alcohol use: No    Alcohol/week: 0.0 standard drinks   Drug  use: No    Review of Systems Constitutional: No fever/chills Eyes: No visual changes. ENT: No sore throat. Cardiovascular: Reports right upper abdominal pain seems to radiate towards her right chest.  Respiratory: Denies shortness of breath. Gastrointestinal: See HPI Genitourinary: Negative for dysuria. Musculoskeletal: Negative for back pain. Skin: Negative for rash. Neurological: Negative for headaches, areas of focal weakness or numbness.    ____________________________________________   PHYSICAL EXAM:  VITAL SIGNS: ED Triage Vitals  Enc Vitals Group     BP 08/02/21 0631 (!) 137/96     Pulse Rate 08/02/21 0631 100     Resp 08/02/21 0631 18     Temp 08/02/21 0631 97.9 F (36.6 C)     Temp Source 08/02/21 0631 Oral     SpO2 08/02/21 0631 97 %     Weight 08/02/21 0630 160 lb (72.6 kg)     Height 08/02/21 0630  (1.6 m)     Head Circumference --      Peak Flow --      Pain Score 08/02/21 0630 8     Pain Loc --      Pain Edu? --      Excl. in GC? --     Constitutional: Alert and oriented. Well appearing and in no acute distress.  She is very pleasant.  She does appear to be in some pain when I discussed examine her she seems to consistently when to hold her right hand towards her right upper quadrant. Eyes: Conjunctivae are normal. Head: Atraumatic. Nose: No congestion/rhinnorhea. Mouth/Throat: Mucous membranes are moist. Neck: No stridor.  Cardiovascular: Normal rate, regular rhythm. Grossly normal heart sounds.  Good peripheral circulation. Respiratory: Normal respiratory effort.  No retractions. Lungs CTAB. Gastrointestinal: Soft and nontender except for reproducible tenderness in the right upper quadrant worse with palpation and inspiration, positive Murphy sign.  No pain McBurney's point.  No lower abdominal pain.  No suprapubic tenderness.. No distention.  Negative for CVA tenderness bilateral. Musculoskeletal: No lower extremity tenderness nor  edema. Neurologic:  Normal speech and language. No gross focal neurologic deficits are appreciated.  Skin:  Skin is warm, dry and intact. No rash noted. Psychiatric: Mood and affect are normal. Speech and behavior are normal.  ____________________________________________   LABS (all labs ordered are listed, but only abnormal results are displayed)  Labs Reviewed  CBC WITH DIFFERENTIAL/PLATELET - Abnormal; Notable for the following components:      Result Value   WBC 12.1 (*)    RBC 3.76 (*)    HCT 35.2 (*)    Neutro Abs 8.9 (*)    All other components within normal limits  COMPREHENSIVE METABOLIC PANEL - Abnormal; Notable for the following components:   Glucose, Bld 110 (*)    Calcium 8.8 (*)    All other components within normal limits  URINALYSIS, ROUTINE W REFLEX MICROSCOPIC - Abnormal; Notable for the following components:   Color, Urine YELLOW (*)  APPearance HAZY (*)    All other components within normal limits  RESP PANEL BY RT-PCR (FLU A&B, COVID) ARPGX2  RESPIRATORY PANEL BY PCR  LIPASE, BLOOD  TROPONIN I (HIGH SENSITIVITY)  TROPONIN I (HIGH SENSITIVITY)   ____________________________________________  EKG  Reviewed inter by me at 640 Heart rate 110 QRS 99 QTc 460 Sinus tachycardia, no evidence of acute ischemia ____________________________________________  RADIOLOGY  CT Angio Chest PE W and/or Wo Contrast  Result Date: 08/02/2021 CLINICAL DATA:  Right upper quadrant pain, right lower chest pain EXAM: CT ANGIOGRAPHY CHEST CT ABDOMEN AND PELVIS WITH CONTRAST TECHNIQUE: Multidetector CT imaging of the chest was performed using the standard protocol during bolus administration of intravenous contrast. Multiplanar CT image reconstructions and MIPs were obtained to evaluate the vascular anatomy. Multidetector CT imaging of the abdomen and pelvis was performed using the standard protocol during bolus administration of intravenous contrast. CONTRAST:  1mL OMNIPAQUE  IOHEXOL 350 MG/ML SOLN COMPARISON:  None. FINDINGS: CTA CHEST FINDINGS Cardiovascular: Satisfactory opacification of the pulmonary arteries to the segmental level. No evidence of pulmonary embolism. The heart is not enlarged. There is no pericardial effusion. Coronary artery calcifications are noted. The main pulmonary artery is enlarged which can be seen with pulmonary hypertension. Mediastinum/Nodes: The imaged thyroid is unremarkable. The esophagus is grossly unremarkable. There is no mediastinal, hilar, or axillary lymphadenopathy. Lungs/Pleura: The trachea and central airways are patent. There is mild central bronchial wall thickening. There is mosaic attenuation in the lungs. There is mild interlobular septal thickening in the apices. Reticular and ground-glass opacities in the lung bases most likely reflect dependent subsegmental atelectasis. There is no focal consolidation. There is no pleural effusion or pneumothorax. There is a small calcified granuloma in the lateral left base. Musculoskeletal: There is no acute osseous abnormality or aggressive osseous lesion. Review of the MIP images confirms the above findings. CT ABDOMEN and PELVIS FINDINGS Hepatobiliary: The liver and gallbladder are unremarkable. There is no biliary ductal dilatation. Pancreas: Unremarkable Spleen: Unremarkable Adrenals/Urinary Tract: The adrenals are unremarkable. The kidneys are normal, with no focal lesion, stone, hydronephrosis, or hydroureter. The bladder is unremarkable. Stomach/Bowel: The stomach is unremarkable. There is no evidence of bowel obstruction. There is no abnormal bowel wall thickening or inflammatory change. The appendix is normal. Vascular/Lymphatic: The abdominal aorta is nonaneurysmal. The major branch vessels are patent. The main portal and splenic veins are patent. Reproductive: The uterus is surgically absent. There is a multi-septated cystic lesion in the right adnexa measuring 4.0 cm by 2.2 cm by 2.8 cm  likely reflecting the ovary with physiologic follicles. The left ovary is not identified. Other: There is trace simple fluid in the pelvis, within physiologic limits. There is no free intraperitoneal air. Musculoskeletal: There is no acute osseous abnormality or aggressive osseous lesion. Review of the MIP images confirms the above findings. IMPRESSION: 1. No evidence of pulmonary embolism. 2. Trace pulmonary interstitial edema. 3. Mosaic attenuation of the lungs suggesting small airway disease/air trapping, and mild central bronchial wall thickening which can be seen with bronchitis. 4. Enlarged pulmonary artery which can be seen with pulmonary hypertension. 5. No acute findings in the abdomen or pelvis. Electronically Signed   By: Lesia Hausen M.D.   On: 08/02/2021 11:48   CT ABDOMEN PELVIS W CONTRAST  Result Date: 08/02/2021 CLINICAL DATA:  Right upper quadrant pain, right lower chest pain EXAM: CT ANGIOGRAPHY CHEST CT ABDOMEN AND PELVIS WITH CONTRAST TECHNIQUE: Multidetector CT imaging of the chest was performed using the  standard protocol during bolus administration of intravenous contrast. Multiplanar CT image reconstructions and MIPs were obtained to evaluate the vascular anatomy. Multidetector CT imaging of the abdomen and pelvis was performed using the standard protocol during bolus administration of intravenous contrast. CONTRAST:  34mL OMNIPAQUE IOHEXOL 350 MG/ML SOLN COMPARISON:  None. FINDINGS: CTA CHEST FINDINGS Cardiovascular: Satisfactory opacification of the pulmonary arteries to the segmental level. No evidence of pulmonary embolism. The heart is not enlarged. There is no pericardial effusion. Coronary artery calcifications are noted. The main pulmonary artery is enlarged which can be seen with pulmonary hypertension. Mediastinum/Nodes: The imaged thyroid is unremarkable. The esophagus is grossly unremarkable. There is no mediastinal, hilar, or axillary lymphadenopathy. Lungs/Pleura: The  trachea and central airways are patent. There is mild central bronchial wall thickening. There is mosaic attenuation in the lungs. There is mild interlobular septal thickening in the apices. Reticular and ground-glass opacities in the lung bases most likely reflect dependent subsegmental atelectasis. There is no focal consolidation. There is no pleural effusion or pneumothorax. There is a small calcified granuloma in the lateral left base. Musculoskeletal: There is no acute osseous abnormality or aggressive osseous lesion. Review of the MIP images confirms the above findings. CT ABDOMEN and PELVIS FINDINGS Hepatobiliary: The liver and gallbladder are unremarkable. There is no biliary ductal dilatation. Pancreas: Unremarkable Spleen: Unremarkable Adrenals/Urinary Tract: The adrenals are unremarkable. The kidneys are normal, with no focal lesion, stone, hydronephrosis, or hydroureter. The bladder is unremarkable. Stomach/Bowel: The stomach is unremarkable. There is no evidence of bowel obstruction. There is no abnormal bowel wall thickening or inflammatory change. The appendix is normal. Vascular/Lymphatic: The abdominal aorta is nonaneurysmal. The major branch vessels are patent. The main portal and splenic veins are patent. Reproductive: The uterus is surgically absent. There is a multi-septated cystic lesion in the right adnexa measuring 4.0 cm by 2.2 cm by 2.8 cm likely reflecting the ovary with physiologic follicles. The left ovary is not identified. Other: There is trace simple fluid in the pelvis, within physiologic limits. There is no free intraperitoneal air. Musculoskeletal: There is no acute osseous abnormality or aggressive osseous lesion. Review of the MIP images confirms the above findings. IMPRESSION: 1. No evidence of pulmonary embolism. 2. Trace pulmonary interstitial edema. 3. Mosaic attenuation of the lungs suggesting small airway disease/air trapping, and mild central bronchial wall thickening  which can be seen with bronchitis. 4. Enlarged pulmonary artery which can be seen with pulmonary hypertension. 5. No acute findings in the abdomen or pelvis. Electronically Signed   By: Lesia Hausen M.D.   On: 08/02/2021 11:48   US Abdomen Limited RUQ (LIVER/GB)  Result Date: 08/02/2021 CLINICAL DATA:  Right upper quadrant pain for 2 days EXAM: ULTRASOUND ABDOMEN LIMITED RIGHT UPPER QUADRANT COMPARISON:  None. FINDINGS: Gallbladder: The gallbladder is normal in appearance. No gallstones or wall thickening is identified. The tech did report a positive sonographic Murphy's sign. Common bile duct: Diameter: 3 mm Liver: Parenchymal echogenicity is mildly increased. No focal lesions are seen. Portal vein is patent on color Doppler imaging with normal direction of blood flow towards the liver. Other: None. IMPRESSION: 1. Reportedly positive sonographic Murphy's sign in the absence of other findings to suggest cholelithiasis or acute cholecystitis. HIDA scan may be considered to evaluate patency of the cystic duct. 2. Mildly increased hepatic parenchymal echogenicity suggesting mild steatosis. Electronically Signed   By: Lesia Hausen M.D.   On: 08/02/2021 10:11      Imaging studies reviewed.  CT chest  negative for PE.  Small interstitial edema, also some potential bronchitic type patterns noted and also seen is a  enlarged pulmonary artery.  I discussed the CT imaging as well as the patient's clinical history with both her cardiologist Dr. Welton Flakes and pulmonary (Dr. Karna Christmas) who is the patient's primary pulmonologist.  Both recommend outpatient follow-up, patient will be able to see and she and her husband affirm will follow up at 9 AM tomorrow with cardiology, and Dr.Aleskerov will have his clinic set up an appointment for likely Monday ____________________________________________   PROCEDURES  Procedure(s) performed: None  Procedures  Critical Care performed:  No  ____________________________________________   INITIAL IMPRESSION / ASSESSMENT AND PLAN / ED COURSE  Pertinent labs & imaging results that were available during my care of the patient were reviewed by me and considered in my medical decision making (see chart for details).   Differential diagnosis includes but is not limited to, abdominal perforation, aortic dissection, cholecystitis, appendicitis, diverticulitis, colitis, esophagitis/gastritis, kidney stone, pyelonephritis, urinary tract infection, aortic aneurysm. All are considered in decision and treatment plan. Based upon the patient's presentation and risk factors, given the location of discomfort in the right upper quadrant as well as radiation towards the right chest and the reproducibility by right upper quadrant exam with a positive Murphy sign I have a high suspicion for possible acute biliary type process.  LFTs normal.  Denies any obvious intra-abdominal symptoms other than the pain location but denies diarrhea vomiting fevers chills.  Lung sounds and chest exam are normal by physical exam EKG reassuring with mild tachycardia.  Discussed further with the patient and she does report that 2 weeks ago she had a cough cold upper respiratory infection and this was treated with prednisone and azithromycin and her symptoms resolved.  She is no longer having cough difficulty breathing wheezing etc.  Although symptoms have resolved.  She uses as needed prescription for prednisone and azithromycin through pulmonologist Dr. Karna Christmas    No clinical signs or symptoms to strongly suggest pneumonia, pulmonary embolism, dissection ACS or acute chest  emergency denoted.  We will proceed initially with right upper quadrant ultrasound   Clinical Course as of 08/02/21 1544  Thu Aug 02, 2021  1044 Ultrasound results reviewed and patient does have notable pain in the region McBurney's point.  No ultrasound findings to suggest obvious acute right  upper quadrant process though.  Discussed with the patient and her husband at the bedside will provide Toradol for additional pain relief is morphine only causing mild relief.  Additionally I have ordered CT imaging of the chest and abdomen to further elucidate for cause of right upper quadrant discomfort [MQ]    Clinical Course User Index [MQ] Sharyn Creamer, MD    Discussed results, follow-up with patient and her husband at the bedside.  After receiving pain medication in particular Toradol she reports her pain improved notably.  She is currently no longer experiencing tenderness on reexam no notable tenderness in the right upper quadrant.  Resting comfortably with clear lung sounds normal respiratory pattern.  Believe that she would be appropriate for close outpatient follow-up, we discussed careful return precautions as the etiology of her right upper quadrant pain is not known at this time but she does have some findings suggestive need for close follow-up with cardiology for which she is agreeable to follow-up at 9 AM as well as will follow-up with pulmonologist.  Return precautions and treatment recommendations and follow-up discussed with the patient who is agreeable  with the plan.   Toradol prescription given as it had excellent effect on treating her pain.  We did discuss risks around use of this medication in particular those around protesters for gastric bleeding, patient understanding ____________________________________________   FINAL CLINICAL IMPRESSION(S) / ED DIAGNOSES  Final diagnoses:  RUQ abdominal pain  Post-viral reactive airway disease        Note:  This document was prepared using Dragon voice recognition software and may include unintentional dictation errors       Sharyn Creamer, MD 08/02/21 1545

## 2021-08-06 ENCOUNTER — Other Ambulatory Visit: Payer: Self-pay | Admitting: Family Medicine

## 2021-08-06 ENCOUNTER — Other Ambulatory Visit (HOSPITAL_COMMUNITY): Payer: Self-pay | Admitting: Family Medicine

## 2021-08-06 DIAGNOSIS — R1011 Right upper quadrant pain: Secondary | ICD-10-CM

## 2021-08-10 ENCOUNTER — Other Ambulatory Visit: Payer: Self-pay | Admitting: Pulmonary Disease

## 2021-08-10 ENCOUNTER — Other Ambulatory Visit (HOSPITAL_BASED_OUTPATIENT_CLINIC_OR_DEPARTMENT_OTHER): Payer: Self-pay | Admitting: Pulmonary Disease

## 2021-08-10 DIAGNOSIS — N200 Calculus of kidney: Secondary | ICD-10-CM

## 2021-08-10 DIAGNOSIS — J4541 Moderate persistent asthma with (acute) exacerbation: Secondary | ICD-10-CM

## 2021-08-21 ENCOUNTER — Other Ambulatory Visit: Payer: Self-pay

## 2021-08-21 ENCOUNTER — Encounter
Admission: RE | Admit: 2021-08-21 | Discharge: 2021-08-21 | Disposition: A | Discharge status: Home / Self Care | Payer: BLUE CROSS/BLUE SHIELD | Source: Ambulatory Visit | Attending: Family Medicine | Admitting: Family Medicine

## 2021-08-21 ENCOUNTER — Ambulatory Visit
Admission: RE | Admit: 2021-08-21 | Discharge: 2021-08-21 | Disposition: A | Discharge status: Home / Self Care | Payer: BLUE CROSS/BLUE SHIELD | Source: Ambulatory Visit | Attending: Pulmonary Disease | Admitting: Pulmonary Disease

## 2021-08-21 DIAGNOSIS — R1011 Right upper quadrant pain: Secondary | ICD-10-CM | POA: Diagnosis present

## 2021-08-21 DIAGNOSIS — N200 Calculus of kidney: Secondary | ICD-10-CM | POA: Insufficient documentation

## 2021-08-21 DIAGNOSIS — J4541 Moderate persistent asthma with (acute) exacerbation: Secondary | ICD-10-CM | POA: Insufficient documentation

## 2021-08-21 MED ORDER — TECHNETIUM TC 99M MEBROFENIN IV KIT
5.0000 | PACK | Freq: Once | INTRAVENOUS | Status: AC | PRN
  Administered 2021-08-21: 4.99 via INTRAVENOUS

## 2022-09-10 ENCOUNTER — Other Ambulatory Visit: Payer: Self-pay | Admitting: Family Medicine

## 2022-09-10 DIAGNOSIS — Z1231 Encounter for screening mammogram for malignant neoplasm of breast: Secondary | ICD-10-CM

## 2022-11-04 ENCOUNTER — Ambulatory Visit
Admission: RE | Admit: 2022-11-04 | Discharge: 2022-11-04 | Disposition: A | Discharge status: Home / Self Care | Payer: Commercial Managed Care - HMO | Source: Ambulatory Visit | Attending: Family Medicine | Admitting: Family Medicine

## 2022-11-04 DIAGNOSIS — Z1231 Encounter for screening mammogram for malignant neoplasm of breast: Secondary | ICD-10-CM

## 2022-11-06 ENCOUNTER — Other Ambulatory Visit: Payer: Self-pay | Admitting: Family Medicine

## 2022-11-06 DIAGNOSIS — N6310 Unspecified lump in the right breast, unspecified quadrant: Secondary | ICD-10-CM

## 2022-11-22 ENCOUNTER — Other Ambulatory Visit: Payer: Commercial Managed Care - HMO

## 2022-12-16 IMAGING — NM NM HEPATO W/GB/PHARM/[PERSON_NAME]
2 series · 12 of 12 positions shown · non-contrast
Comparison: August 02, 2021.

CLINICAL DATA: Acute right upper quadrant abdominal pain.

EXAM:
NUCLEAR MEDICINE HEPATOBILIARY IMAGING WITH GALLBLADDER EF
TECHNIQUE: Sequential images of the abdomen were obtained [DATE] minutes
following intravenous administration of radiopharmaceutical. After
oral ingestion of Ensure, gallbladder ejection fraction was
determined. At 60 min, normal ejection fraction is greater than 33%.
RADIOPHARMACEUTICALS:  4.99 mCi 2c-QQm  Choletec IV

[Series 1000: hepatobiliary scan · 9.59mm/px · 6 of 60 frames shown]
[frame 6/60]
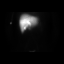
[frame 16/60]
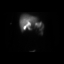
[frame 26/60]
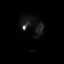
[frame 36/60]
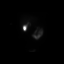
[frame 46/60]
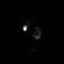
[frame 56/60]
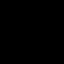

[Series 1000: gallbladder ef · 4.80mm/px · 6 of 120 frames shown]
[frame 11/120]
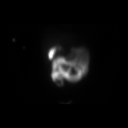
[frame 31/120]
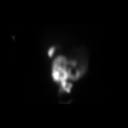
[frame 51/120]
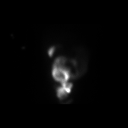
[frame 71/120]
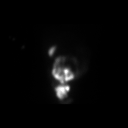
[frame 91/120]
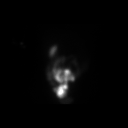
[frame 111/120]
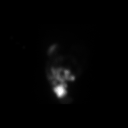

[12 of 12 positions shown; findings below may reference images not displayed]

FINDINGS: Prompt uptake and biliary excretion of activity by the liver is
seen. Gallbladder activity is visualized, consistent with patency of
cystic duct. Biliary activity passes into small bowel, consistent
with patent common bile duct.

Calculated gallbladder ejection fraction is 78%. (Normal gallbladder
ejection fraction with Ensure is greater than 33%. Patient reported
no symptoms with Ensure administration.)
IMPRESSION: Normal gallbladder ejection fraction after Ensure administration.

## 2022-12-16 IMAGING — US US RENAL
1 series · 15 of 25 positions shown · non-contrast
Comparison: CT 08/02/2021

CLINICAL DATA: History of nephrolithiasis

EXAM:
RENAL / URINARY TRACT ULTRASOUND COMPLETE

[Series 1: us renal · 15 of 33 slices shown]
[im 1/33]
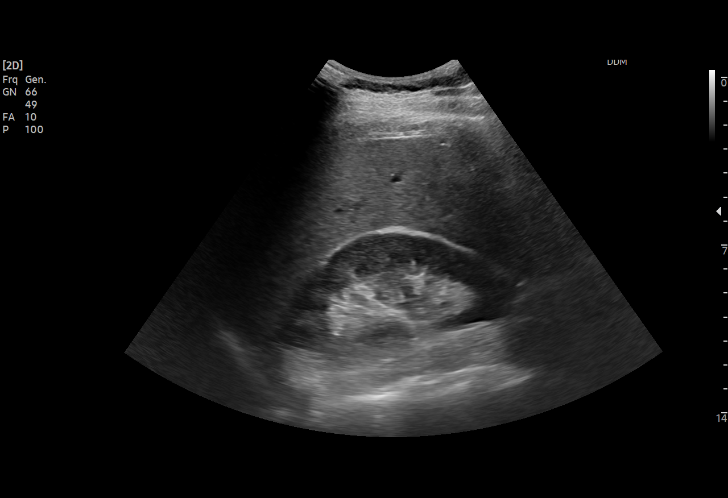
[im 3/33]
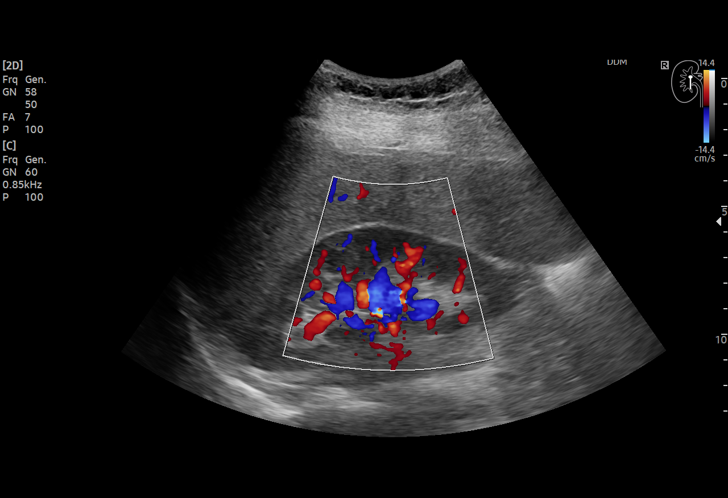
[im 6/33]
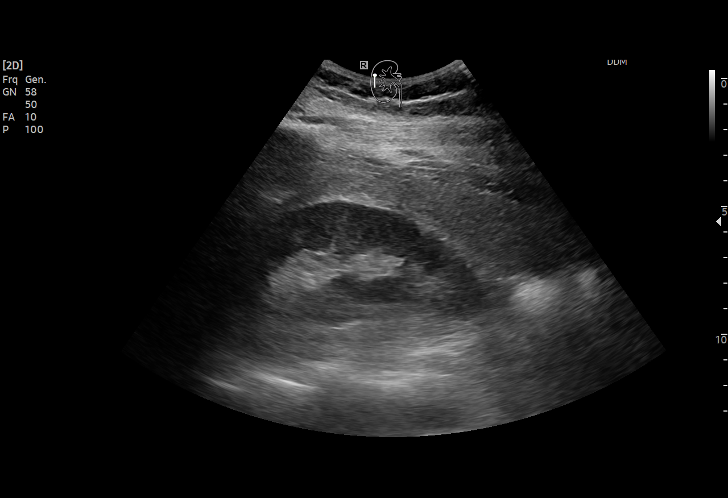
[im 7/33]
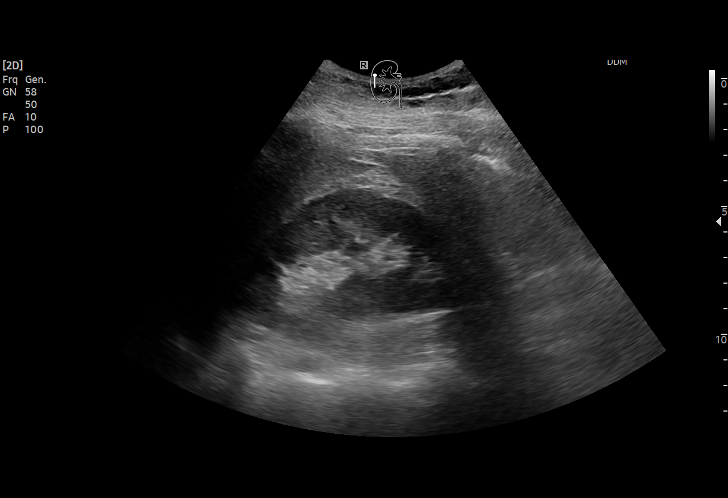
[im 10/33]
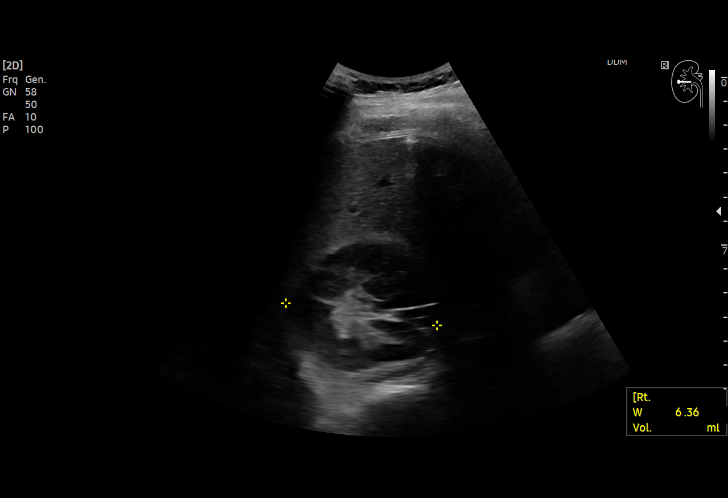
[im 13/33]
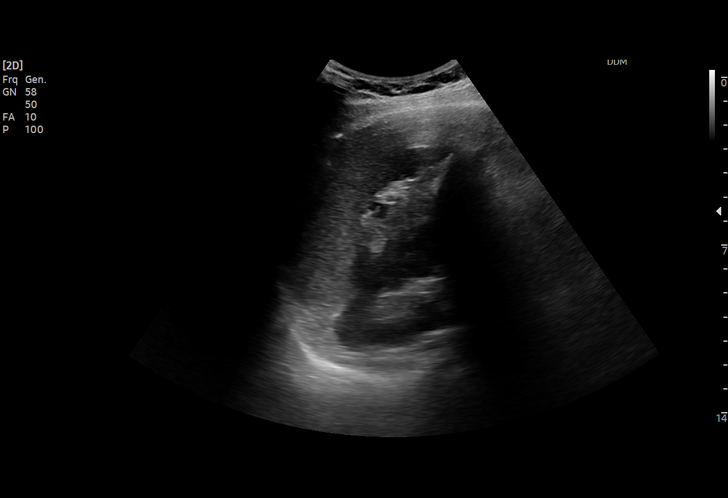
[im 14/33]
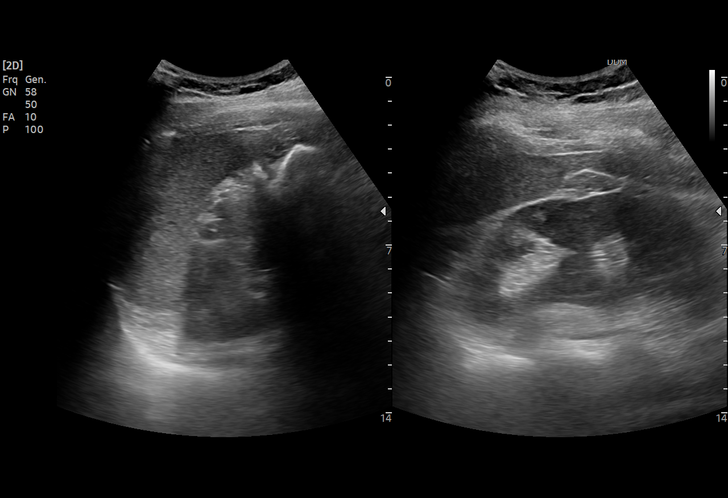
[im 17/33]
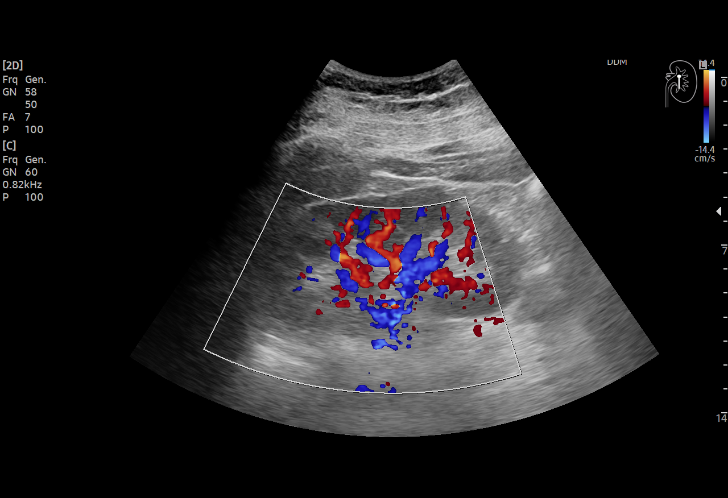
[im 19/33]
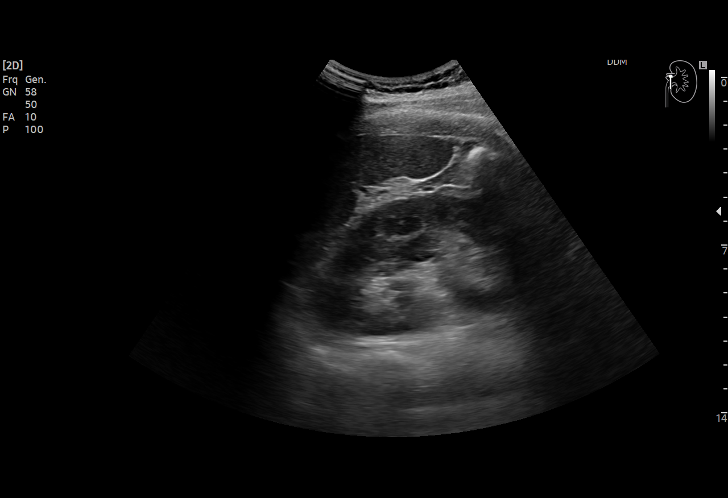
[im 21/33]
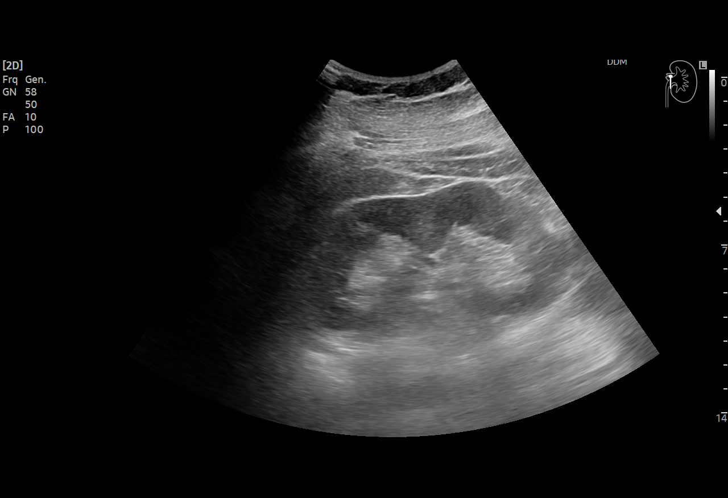
[im 23/33]
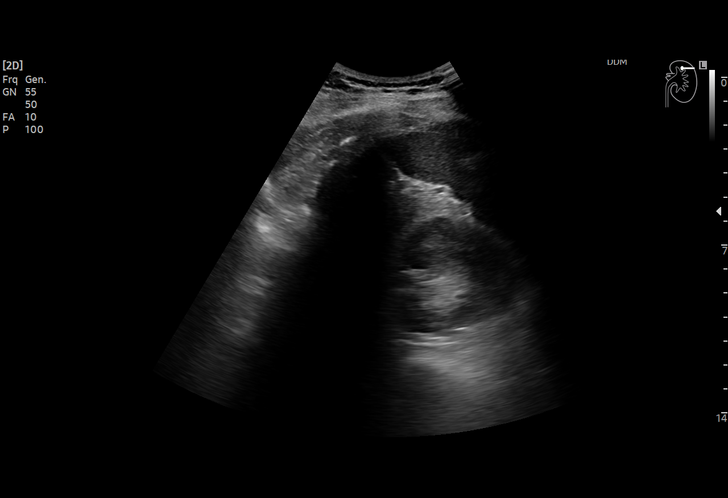
[im 26/33]
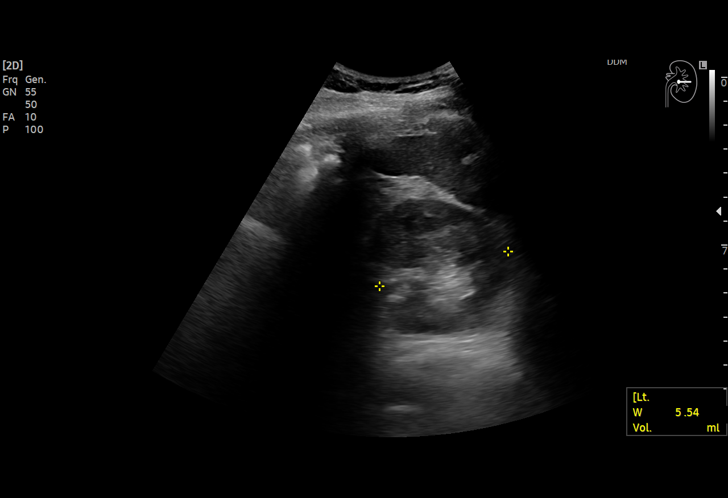
[im 27/33]
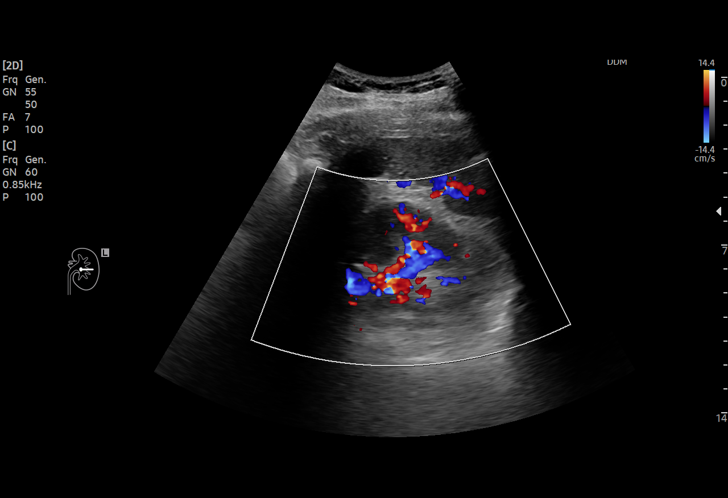
[im 30/33]
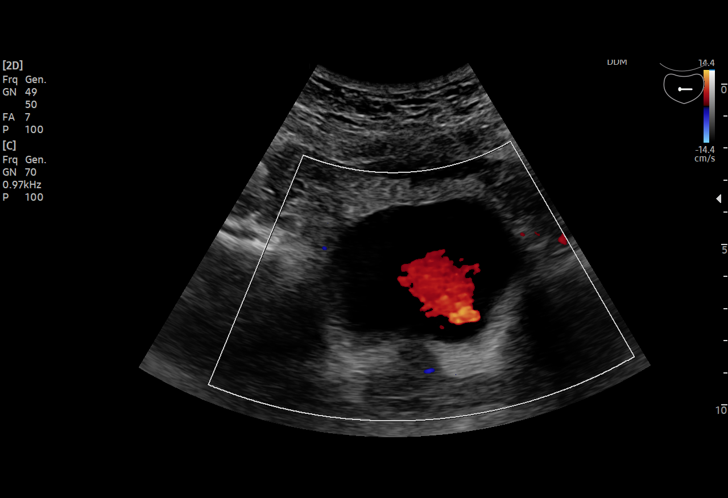
[im 33/33]
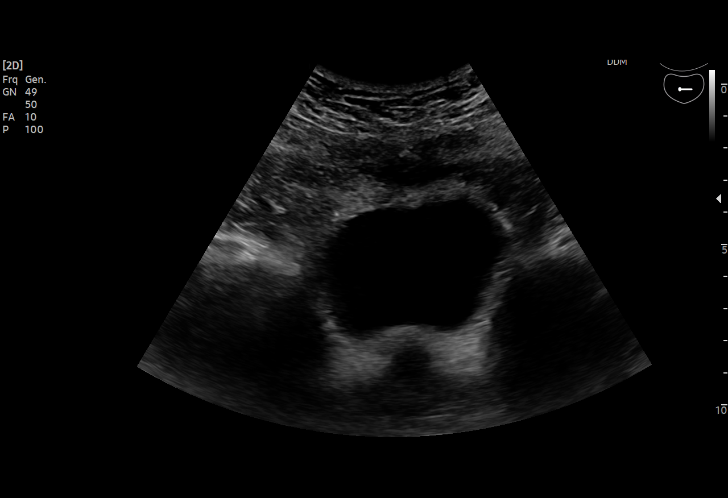

[15 of 25 positions shown; findings below may reference images not displayed]

FINDINGS: Right Kidney:

Renal measurements: 11.7 x 4.5 x 6.4 cm = volume: 175 mL.
Echogenicity within normal limits. No mass or hydronephrosis
visualized.

Left Kidney:

Renal measurements: 11.5 x 4.9 x 5.5 cm = volume: 163 mL.
Echogenicity within normal limits. No mass or hydronephrosis
visualized.

Bladder:

Appears normal for degree of bladder distention.

Other:

None.
IMPRESSION: Negative renal ultrasound

## 2024-10-18 ENCOUNTER — Other Ambulatory Visit: Payer: Self-pay | Admitting: Rheumatology

## 2024-10-18 ENCOUNTER — Encounter: Payer: Self-pay | Admitting: Rheumatology

## 2024-10-18 DIAGNOSIS — G8929 Other chronic pain: Secondary | ICD-10-CM

## 2024-10-18 DIAGNOSIS — M1380 Other specified arthritis, unspecified site: Secondary | ICD-10-CM

## 2024-10-20 ENCOUNTER — Inpatient Hospital Stay
Admission: RE | Admit: 2024-10-20 | Discharge: 2024-10-20 | Disposition: A | Source: Ambulatory Visit | Attending: Rheumatology | Admitting: Rheumatology

## 2024-10-20 DIAGNOSIS — G8929 Other chronic pain: Secondary | ICD-10-CM

## 2024-10-20 DIAGNOSIS — M1380 Other specified arthritis, unspecified site: Secondary | ICD-10-CM

## 2024-10-23 ENCOUNTER — Other Ambulatory Visit
